# Patient Record
Sex: Female | Born: 1985
Health system: Southern US, Community
[De-identification: ages and names within clinical notes are randomized; demographics above are authoritative.]

## PROBLEM LIST (undated history)

## (undated) DIAGNOSIS — M7072 Other bursitis of hip, left hip: Secondary | ICD-10-CM

## (undated) HISTORY — PX: WISDOM TOOTH EXTRACTION: SHX21

## (undated) HISTORY — PX: EYE SURGERY: SHX253

---

## 2009-07-21 ENCOUNTER — Inpatient Hospital Stay (HOSPITAL_COMMUNITY): Admission: RE | Admit: 2009-07-21 | Discharge: 2009-07-24 | Payer: Self-pay | Admitting: Orthopedic Surgery

## 2010-10-23 LAB — CBC
Hemoglobin: 10.1 g/dL — ABNORMAL LOW (ref 12.0–15.0)
Hemoglobin: 14 g/dL (ref 12.0–15.0)
MCHC: 34.1 g/dL (ref 30.0–36.0)
MCV: 98.3 fL (ref 78.0–100.0)
RBC: 2.96 MIL/uL — ABNORMAL LOW (ref 3.87–5.11)
RBC: 4.18 MIL/uL (ref 3.87–5.11)
RDW: 13.5 % (ref 11.5–15.5)
WBC: 12.8 10*3/uL — ABNORMAL HIGH (ref 4.0–10.5)

## 2010-10-23 LAB — RH IMMUNE GLOB WKUP(>/=20WKS)(NOT WOMEN'S HOSP)

## 2010-10-23 LAB — RPR: RPR Ser Ql: NONREACTIVE

## 2010-11-08 LAB — ABO/RH: RH Type: NEGATIVE

## 2010-11-08 LAB — ANTIBODY SCREEN: Antibody Screen: NEGATIVE

## 2010-11-08 LAB — HIV ANTIBODY (ROUTINE TESTING W REFLEX): HIV: NONREACTIVE

## 2010-11-08 LAB — GC/CHLAMYDIA PROBE AMP, GENITAL: Chlamydia: NEGATIVE

## 2011-03-20 ENCOUNTER — Other Ambulatory Visit: Payer: Self-pay | Admitting: Obstetrics & Gynecology

## 2011-04-25 ENCOUNTER — Other Ambulatory Visit: Payer: Self-pay | Admitting: Obstetrics & Gynecology

## 2011-06-07 ENCOUNTER — Encounter (HOSPITAL_COMMUNITY): Payer: Self-pay

## 2011-06-07 NOTE — Patient Instructions (Addendum)
   Your procedure is scheduled on:  Monday, November 26th  Enter through the Main Entrance of [facility] at:  [employer] up the phone at the desk and dial [phone] and inform us of your arrival.  Please call this number if you have any problems the morning of surgery: [phone]  Remember: Do not eat food after midnight: Sunday Do not drink clear liquids after:midnight  Sunday Take these medicines the morning of surgery with a SIP OF WATER:  none Do not wear jewelry, make-up, or FINGER nail polish Do not wear lotions, powders, or perfumes.  You may not  wear deodorant. Do not shave 48 hours prior to surgery. Do not bring valuables to the hospital.  Leave suitcase in the car. After Surgery it may be brought to your room. For patients being admitted to the hospital, checkout time is 11:00am the day of discharge. Home with husband Barbara Cower cell [phone].  Remember to use your hibiclens as instructed.Please shower with 1/2 bottle the evening before your surgery and the other 1/2 bottle the morning of surgery.

## 2011-06-11 ENCOUNTER — Encounter (HOSPITAL_COMMUNITY): Payer: Self-pay | Admitting: Pharmacy Technician

## 2011-06-12 ENCOUNTER — Encounter (HOSPITAL_COMMUNITY)
Admission: RE | Admit: 2011-06-12 | Discharge: 2011-06-12 | Disposition: A | Discharge status: Home / Self Care | Payer: BC Managed Care – PPO | Source: Ambulatory Visit | Attending: Obstetrics & Gynecology | Admitting: Obstetrics & Gynecology

## 2011-06-12 ENCOUNTER — Encounter (HOSPITAL_COMMUNITY): Payer: Self-pay

## 2011-06-12 HISTORY — DX: Other bursitis of hip, left hip: M70.72

## 2011-06-12 LAB — CBC
HCT: 40.8 % (ref 36.0–46.0)
Hemoglobin: 14 g/dL (ref 12.0–15.0)
MCH: 32.3 pg (ref 26.0–34.0)
MCV: 94.2 fL (ref 78.0–100.0)
Platelets: 200 10*3/uL (ref 150–400)
RBC: 4.33 MIL/uL (ref 3.87–5.11)
WBC: 12.1 10*3/uL — ABNORMAL HIGH (ref 4.0–10.5)

## 2011-06-12 NOTE — Pre-Procedure Instructions (Signed)
OK to see patient on DOS.

## 2011-06-12 NOTE — Pre-Procedure Instructions (Signed)
Called Dr Mechele Collin office, spoke with Kennyth Arnold regarding patient's elevated WBCs 12.1.

## 2011-06-15 NOTE — H&P (Signed)
25 year old G2P1 admitted at [redacted] weeks gestation (EDC 06-24-11) for a repeat C/S after a relatively benign pregnancy.  -GBS.  Blood type O negative with RhoGam given at [redacted] weeks gestation.  Her first C/S was in 12/10 (an elective primary) with delivery of a 7 pound female infant.  PE:  VS stable Chest - Clear CV - RR without murmur Abd:  S+37  FH + CX:  Closed with vtx at -2  IMP:  39 week IUP with a previous C/S who will have repeat C/S Plan:  Repeat C/S at [redacted] weeks gestation

## 2011-06-18 ENCOUNTER — Encounter (HOSPITAL_COMMUNITY): Payer: Self-pay | Admitting: *Deleted

## 2011-06-18 ENCOUNTER — Encounter (HOSPITAL_COMMUNITY): Payer: Self-pay | Admitting: Anesthesiology

## 2011-06-18 ENCOUNTER — Encounter (HOSPITAL_COMMUNITY)
Admission: RE | Disposition: A | Discharge status: Home / Self Care | Payer: Self-pay | Source: Ambulatory Visit | Attending: Obstetrics & Gynecology

## 2011-06-18 ENCOUNTER — Inpatient Hospital Stay (HOSPITAL_COMMUNITY)
Admission: RE | Admit: 2011-06-18 | Discharge: 2011-06-20 | DRG: 371 | Disposition: A | Discharge status: Home / Self Care | Payer: BC Managed Care – PPO | Source: Ambulatory Visit | Attending: Obstetrics & Gynecology | Admitting: Obstetrics & Gynecology

## 2011-06-18 ENCOUNTER — Inpatient Hospital Stay (HOSPITAL_COMMUNITY): Payer: BC Managed Care – PPO | Admitting: Anesthesiology

## 2011-06-18 DIAGNOSIS — O34219 Maternal care for unspecified type scar from previous cesarean delivery: Principal | ICD-10-CM | POA: Diagnosis present

## 2011-06-18 DIAGNOSIS — Z3480 Encounter for supervision of other normal pregnancy, unspecified trimester: Secondary | ICD-10-CM

## 2011-06-18 SURGERY — Surgical Case
Anesthesia: Spinal | Site: Abdomen | Wound class: Clean Contaminated

## 2011-06-18 MED ORDER — CEFAZOLIN SODIUM 1-5 GM-% IV SOLN
INTRAVENOUS | Status: AC
  Administered 2011-06-18: 1 g via INTRAVENOUS
  Filled 2011-06-18: qty 50

## 2011-06-18 MED ORDER — OXYTOCIN 20 UNITS IN LACTATED RINGERS INFUSION - SIMPLE: 125.0000 mL/h | INTRAVENOUS | Status: AC

## 2011-06-18 MED ORDER — PRENATAL PLUS 27-1 MG PO TABS: 1.0000 | ORAL_TABLET | Freq: Every day | ORAL | Status: DC

## 2011-06-18 MED ORDER — ONDANSETRON HCL 4 MG/2ML IJ SOLN
INTRAMUSCULAR | Status: AC
  Filled 2011-06-18: qty 2

## 2011-06-18 MED ORDER — WITCH HAZEL-GLYCERIN EX PADS: 1.0000 "application " | MEDICATED_PAD | CUTANEOUS | Status: DC | PRN

## 2011-06-18 MED ORDER — PHENYLEPHRINE 40 MCG/ML (10ML) SYRINGE FOR IV PUSH (FOR BLOOD PRESSURE SUPPORT)
PREFILLED_SYRINGE | INTRAVENOUS | Status: AC
  Filled 2011-06-18: qty 5

## 2011-06-18 MED ORDER — OXYCODONE-ACETAMINOPHEN 5-325 MG/5ML PO SOLN
5.0000 mL | ORAL | Status: DC | PRN
  Administered 2011-06-18 – 2011-06-19 (×4): 5 mL via ORAL
  Administered 2011-06-20 (×3): 10 mL via ORAL
  Administered 2011-06-20: 5 mL via ORAL
  Filled 2011-06-18: qty 10
  Filled 2011-06-18 (×3): qty 5
  Filled 2011-06-18: qty 10
  Filled 2011-06-18 (×2): qty 5
  Filled 2011-06-18: qty 10

## 2011-06-18 MED ORDER — NALBUPHINE HCL 10 MG/ML IJ SOLN
5.0000 mg | INTRAMUSCULAR | Status: DC | PRN
  Filled 2011-06-18: qty 1

## 2011-06-18 MED ORDER — SCOPOLAMINE 1 MG/3DAYS TD PT72: 1.0000 | MEDICATED_PATCH | Freq: Once | TRANSDERMAL | Status: DC

## 2011-06-18 MED ORDER — DIPHENHYDRAMINE HCL 25 MG PO CAPS: 25.0000 mg | ORAL_CAPSULE | Freq: Four times a day (QID) | ORAL | Status: DC | PRN

## 2011-06-18 MED ORDER — KETOROLAC TROMETHAMINE 30 MG/ML IJ SOLN: 30.0000 mg | Freq: Four times a day (QID) | INTRAMUSCULAR | Status: AC | PRN

## 2011-06-18 MED ORDER — SODIUM CHLORIDE 0.9 % IJ SOLN: 3.0000 mL | INTRAMUSCULAR | Status: DC | PRN

## 2011-06-18 MED ORDER — LANOLIN HYDROUS EX OINT: 1.0000 "application " | TOPICAL_OINTMENT | CUTANEOUS | Status: DC | PRN

## 2011-06-18 MED ORDER — LACTATED RINGERS IV SOLN
INTRAVENOUS | Status: DC
  Administered 2011-06-18: 08:00:00 via INTRAVENOUS
  Administered 2011-06-18: 1000 mL via INTRAVENOUS
  Administered 2011-06-18 (×2): via INTRAVENOUS

## 2011-06-18 MED ORDER — CEFAZOLIN SODIUM 1-5 GM-% IV SOLN: 1.0000 g | INTRAVENOUS | Status: DC

## 2011-06-18 MED ORDER — DIPHENHYDRAMINE HCL 50 MG/ML IJ SOLN: 25.0000 mg | INTRAMUSCULAR | Status: DC | PRN

## 2011-06-18 MED ORDER — NALOXONE HCL 0.4 MG/ML IJ SOLN: 0.4000 mg | INTRAMUSCULAR | Status: DC | PRN

## 2011-06-18 MED ORDER — SENNOSIDES-DOCUSATE SODIUM 8.6-50 MG PO TABS
2.0000 | ORAL_TABLET | Freq: Every day | ORAL | Status: DC
  Administered 2011-06-18 – 2011-06-19 (×2): 2 via ORAL

## 2011-06-18 MED ORDER — COMPLETENATE 29-1 MG PO CHEW
1.0000 | CHEWABLE_TABLET | Freq: Every day | ORAL | Status: DC
  Administered 2011-06-19 – 2011-06-20 (×2): 1 via ORAL
  Filled 2011-06-18 (×4): qty 1

## 2011-06-18 MED ORDER — MENTHOL 3 MG MT LOZG: 1.0000 | LOZENGE | OROMUCOSAL | Status: DC | PRN

## 2011-06-18 MED ORDER — METOCLOPRAMIDE HCL 5 MG/ML IJ SOLN: 10.0000 mg | Freq: Three times a day (TID) | INTRAMUSCULAR | Status: DC | PRN

## 2011-06-18 MED ORDER — ONDANSETRON HCL 4 MG/2ML IJ SOLN: 4.0000 mg | INTRAMUSCULAR | Status: DC | PRN

## 2011-06-18 MED ORDER — SIMETHICONE 80 MG PO CHEW: 80.0000 mg | CHEWABLE_TABLET | ORAL | Status: DC | PRN

## 2011-06-18 MED ORDER — KETOROLAC TROMETHAMINE 30 MG/ML IJ SOLN
30.0000 mg | Freq: Four times a day (QID) | INTRAMUSCULAR | Status: AC | PRN
  Administered 2011-06-18: 30 mg via INTRAVENOUS

## 2011-06-18 MED ORDER — ONDANSETRON HCL 4 MG/2ML IJ SOLN: 4.0000 mg | Freq: Three times a day (TID) | INTRAMUSCULAR | Status: DC | PRN

## 2011-06-18 MED ORDER — IBUPROFEN 600 MG PO TABS: 600.0000 mg | ORAL_TABLET | Freq: Four times a day (QID) | ORAL | Status: DC | PRN

## 2011-06-18 MED ORDER — SODIUM CHLORIDE 0.9 % IV SOLN
1.0000 ug/kg/h | INTRAVENOUS | Status: DC | PRN
  Filled 2011-06-18: qty 2.5

## 2011-06-18 MED ORDER — OXYTOCIN 10 UNIT/ML IJ SOLN
INTRAMUSCULAR | Status: AC
  Filled 2011-06-18: qty 4

## 2011-06-18 MED ORDER — ONDANSETRON HCL 4 MG PO TABS
4.0000 mg | ORAL_TABLET | ORAL | Status: DC | PRN
  Administered 2011-06-20: 4 mg via ORAL
  Filled 2011-06-18: qty 1

## 2011-06-18 MED ORDER — TETANUS-DIPHTH-ACELL PERTUSSIS 5-2.5-18.5 LF-MCG/0.5 IM SUSP
0.5000 mL | Freq: Once | INTRAMUSCULAR | Status: AC
  Administered 2011-06-20: 0.5 mL via INTRAMUSCULAR
  Filled 2011-06-18: qty 0.5

## 2011-06-18 MED ORDER — FENTANYL CITRATE 0.05 MG/ML IJ SOLN
INTRAMUSCULAR | Status: DC | PRN
  Administered 2011-06-18: 25 ug via INTRATHECAL

## 2011-06-18 MED ORDER — SODIUM CHLORIDE 0.9 % IR SOLN
Status: DC | PRN
  Administered 2011-06-18: 1000 mL

## 2011-06-18 MED ORDER — BUPIVACAINE IN DEXTROSE 0.75-8.25 % IT SOLN
INTRATHECAL | Status: DC | PRN
  Administered 2011-06-18: 11 mg via INTRATHECAL

## 2011-06-18 MED ORDER — DIBUCAINE 1 % RE OINT: 1.0000 "application " | TOPICAL_OINTMENT | RECTAL | Status: DC | PRN

## 2011-06-18 MED ORDER — ONDANSETRON HCL 4 MG/2ML IJ SOLN
INTRAMUSCULAR | Status: DC | PRN
  Administered 2011-06-18: 4 mg via INTRAVENOUS

## 2011-06-18 MED ORDER — IBUPROFEN 600 MG PO TABS: 600.0000 mg | ORAL_TABLET | Freq: Four times a day (QID) | ORAL | Status: DC

## 2011-06-18 MED ORDER — HYDROMORPHONE HCL PF 1 MG/ML IJ SOLN: 0.2500 mg | INTRAMUSCULAR | Status: DC | PRN

## 2011-06-18 MED ORDER — OXYCODONE-ACETAMINOPHEN 5-325 MG PO TABS: 1.0000 | ORAL_TABLET | ORAL | Status: DC | PRN

## 2011-06-18 MED ORDER — DIPHENHYDRAMINE HCL 50 MG/ML IJ SOLN: 12.5000 mg | INTRAMUSCULAR | Status: DC | PRN

## 2011-06-18 MED ORDER — LACTATED RINGERS IV SOLN
INTRAVENOUS | Status: DC
  Administered 2011-06-20: 10:00:00 via INTRAVENOUS

## 2011-06-18 MED ORDER — SCOPOLAMINE 1 MG/3DAYS TD PT72
1.0000 | MEDICATED_PATCH | Freq: Once | TRANSDERMAL | Status: DC
  Filled 2011-06-18: qty 1

## 2011-06-18 MED ORDER — SIMETHICONE 80 MG PO CHEW
80.0000 mg | CHEWABLE_TABLET | Freq: Three times a day (TID) | ORAL | Status: DC
  Administered 2011-06-18 – 2011-06-20 (×5): 80 mg via ORAL

## 2011-06-18 MED ORDER — OXYTOCIN 20 UNITS IN LACTATED RINGERS INFUSION - SIMPLE
INTRAVENOUS | Status: DC | PRN
  Administered 2011-06-18 (×2): 20 [IU] via INTRAVENOUS

## 2011-06-18 MED ORDER — ZOLPIDEM TARTRATE 5 MG PO TABS: 5.0000 mg | ORAL_TABLET | Freq: Every evening | ORAL | Status: DC | PRN

## 2011-06-18 MED ORDER — DIPHENHYDRAMINE HCL 25 MG PO CAPS: 25.0000 mg | ORAL_CAPSULE | ORAL | Status: DC | PRN

## 2011-06-18 MED ORDER — MORPHINE SULFATE 0.5 MG/ML IJ SOLN
INTRAMUSCULAR | Status: AC
  Filled 2011-06-18: qty 10

## 2011-06-18 MED ORDER — KETOROLAC TROMETHAMINE 60 MG/2ML IM SOLN
60.0000 mg | Freq: Once | INTRAMUSCULAR | Status: AC | PRN
  Filled 2011-06-18: qty 2

## 2011-06-18 MED ORDER — PHENYLEPHRINE HCL 10 MG/ML IJ SOLN
INTRAMUSCULAR | Status: DC | PRN
  Administered 2011-06-18 (×2): 40 ug via INTRAVENOUS

## 2011-06-18 MED ORDER — MEPERIDINE HCL 25 MG/ML IJ SOLN: 6.2500 mg | INTRAMUSCULAR | Status: DC | PRN

## 2011-06-18 MED ORDER — KETOROLAC TROMETHAMINE 30 MG/ML IJ SOLN
INTRAMUSCULAR | Status: AC
  Administered 2011-06-18: 30 mg via INTRAVENOUS
  Filled 2011-06-18: qty 1

## 2011-06-18 MED ORDER — IBUPROFEN 100 MG/5ML PO SUSP
600.0000 mg | Freq: Four times a day (QID) | ORAL | Status: DC
  Administered 2011-06-18 – 2011-06-20 (×8): 600 mg via ORAL
  Filled 2011-06-18 (×13): qty 30

## 2011-06-18 MED ORDER — MORPHINE SULFATE (PF) 0.5 MG/ML IJ SOLN
INTRAMUSCULAR | Status: DC | PRN
  Administered 2011-06-18: .1 mg via INTRATHECAL

## 2011-06-18 MED ORDER — FENTANYL CITRATE 0.05 MG/ML IJ SOLN
INTRAMUSCULAR | Status: AC
  Filled 2011-06-18: qty 2

## 2011-06-18 MED ORDER — SCOPOLAMINE 1 MG/3DAYS TD PT72
MEDICATED_PATCH | TRANSDERMAL | Status: AC
  Filled 2011-06-18: qty 1

## 2011-06-18 SURGICAL SUPPLY — 28 items
CLOTH BEACON ORANGE TIMEOUT ST (SAFETY) ×2 IMPLANT
DRESSING TELFA 8X3 (GAUZE/BANDAGES/DRESSINGS) ×2 IMPLANT
ELECT REM PT RETURN 9FT ADLT (ELECTROSURGICAL) ×2
ELECTRODE REM PT RTRN 9FT ADLT (ELECTROSURGICAL) ×1 IMPLANT
EXTRACTOR VACUUM M CUP 4 TUBE (SUCTIONS) IMPLANT
GAUZE SPONGE 4X4 12PLY STRL LF (GAUZE/BANDAGES/DRESSINGS) ×2 IMPLANT
GLOVE BIO SURGEON STRL SZ7.5 (GLOVE) ×4 IMPLANT
GOWN PREVENTION PLUS LG XLONG (DISPOSABLE) ×4 IMPLANT
GOWN PREVENTION PLUS XLARGE (GOWN DISPOSABLE) ×2 IMPLANT
KIT ABG SYR 3ML LUER SLIP (SYRINGE) IMPLANT
NEEDLE HYPO 25X5/8 SAFETYGLIDE (NEEDLE) IMPLANT
NS IRRIG 1000ML POUR BTL (IV SOLUTION) ×2 IMPLANT
PACK C SECTION WH (CUSTOM PROCEDURE TRAY) ×2 IMPLANT
PAD ABD 7.5X8 STRL (GAUZE/BANDAGES/DRESSINGS) ×2 IMPLANT
SLEEVE SCD COMPRESS KNEE MED (MISCELLANEOUS) ×2 IMPLANT
STAPLER VISISTAT 35W (STAPLE) ×2 IMPLANT
SUT PLAIN 1 NONE 54 (SUTURE) ×2 IMPLANT
SUT PROLENE 0 TP 1 60 (SUTURE) IMPLANT
SUT VIC AB 0 CT1 27 (SUTURE) ×3
SUT VIC AB 0 CT1 27XBRD ANBCTR (SUTURE) ×3 IMPLANT
SUT VIC AB 1 CTX 36 (SUTURE) ×2
SUT VIC AB 1 CTX36XBRD ANBCTRL (SUTURE) ×2 IMPLANT
SUT VIC AB 3-0 SH 27 (SUTURE)
SUT VIC AB 3-0 SH 27X BRD (SUTURE) IMPLANT
SUT VICRYL 4-0 PS2 18IN ABS (SUTURE) IMPLANT
TOWEL OR 17X24 6PK STRL BLUE (TOWEL DISPOSABLE) ×4 IMPLANT
TRAY FOLEY CATH 14FR (SET/KITS/TRAYS/PACK) ×2 IMPLANT
WATER STERILE IRR 1000ML POUR (IV SOLUTION) IMPLANT

## 2011-06-18 NOTE — Op Note (Signed)
Preoperative diagnoses  #1-39 week intrauterine pregnancy, previous cesarean section, desire for repeat.  Postop diagnoses  #1-same plus delivery of 8 lbs. 1 oz. Female infant Apgars 9 and 9  Procedure  Repeat low transverse cesarean section  Surgeon-Dr. Aldona Bar Asst.-Dr. Greta Doom Anesthesia-subarachnoid block  Summary-this patient, a gravida 2 para 1 at [redacted] weeks gestation presents for a repeat cesarean section at term. Her pregnancy has been fairly uncomplicated.  Patient was taken to the operating room for after the satisfactory induction of a subarachnoid block she was prepped and draped in the usual fashion with a Foley catheter placed before the prep. She did receive 1 g of Ancef IV preoperatively and an appropriate timeout was carried out as well.  After documentation of good anesthetic levels procedure was begun.  A Pfannenstiel incision was made with minimal difficulty dissected down to the fascia which was incised as well and a low transverse fashion. There was minimal subfascial space and the peritoneum was entered easily with care taken to avoid the bladder inferiorly and the towels superiorly. At this time the bladder blade was placed and the vesicouterine peritoneum was incised in low transverse fashion and pushed off the lower uterine segment with ease. Sharp incision into the lower segment was then carried out in low transverse fashion with Metzenbaum scissors. Amniotomy was produced with production of clear fluid and the incision was extended laterally with fingers.  At this time from a vertex position a viable female infant was delivered without difficulty. The infant cried spontaneously at once and after the cord was clamped and cut the infant was passed off the waiting team headed  by Dr. Alison Murray. Weight was found to be 8 lbs. 1 oz. And Apgars were assigned at 9 and 9 and the infant was taken to the nursery in good condition. The placenta was delivered intact. The uterus was  exteriorized and rendered free of any remaining products of conception. Good uterine contractile it was before slowly given intravenous Pitocin and manual stimulation.  At this time closure of the uterine incision was carried out using single layer of #1 Vicryl in a running locking fashion and this was oversewn with several figure-of-eight #1 Vicryls with very adequate hemostasis produced. Tubes and ovaries were inspected and noted to be normal at this time the abdomen was lavaged all free blood and clot, uterine incision was noted to be dry, and the uterus was replaced in the abdominal cavity and after all counts were noted to be corrected no foreign bodies were noted to be remaining in the abdominal cavity closure of the abdominal cavity was carried out.  The abdominal peritoneum was closed with 0 Vicryl in a running fashion and muscles-although somewhat attenuated-were closed the same. Assured of good subfascial hemostasis the fascia was then reapproximated using 0 Vicryl from angle to midline bilaterally. Subcutaneous tissue was then rendered hemostatic. The inferior margin of the incision was then undermined to allow for good skin closure. Skin was then closed with staples and a sterile pressure dressing was applied.  At this time the patient was taken to the recovery room in good condition having tolerated the procedure well. Estimated blood loss approximately 500 cc. All counts correct x2. At the conclusion of the procedure both mother and baby were doing well in their respective recovery areas.

## 2011-06-18 NOTE — Anesthesia Procedure Notes (Signed)
Spinal  Patient location during procedure: OR Start time: 06/18/2011 7:43 AM Staffing Performed by: anesthesiologist  Preanesthetic Checklist Completed: patient identified, site marked, surgical consent, pre-op evaluation, timeout performed, IV checked, risks and benefits discussed and monitors and equipment checked Spinal Block Patient position: sitting Prep: DuraPrep Patient monitoring: heart rate, cardiac monitor, continuous pulse ox and blood pressure Approach: midline Location: L3-4 Injection technique: single-shot Needle Needle type: Sprotte  Needle gauge: 24 G Needle length: 9 cm Assessment Sensory level: T4 Additional Notes Patient identified.  Risk benefits discussed including failed block, incomplete pain control, headache, nerve damage, paralysis, blood pressure changes, nausea, vomiting, reactions to medication both toxic or allergic, and postpartum back pain.  Patient expressed understanding and wished to proceed.  All questions were answered.  Sterile technique used throughout procedure.  CSF was clear.  No parasthesia or other complications.  Please see nursing notes for vital signs.

## 2011-06-18 NOTE — Transfer of Care (Signed)
Immediate Anesthesia Transfer of Care Note  Patient: Shelby Hahn  Procedure(s) Performed:  CESAREAN SECTION  Patient Location: PACU  Anesthesia Type: Spinal  Level of Consciousness: awake, alert  and oriented  Airway & Oxygen Therapy: Patient Spontanous Breathing  Post-op Assessment: Report given to PACU RN and Post -op Vital signs reviewed and stable  Post vital signs: Reviewed and stable  Complications: No apparent anesthesia complications

## 2011-06-18 NOTE — Consult Note (Signed)
Requested to attend scheduled C/S at term gestation with no risk factors reported. At birth infant in vertex and was manually extracted with spontaneous cries and active MAE. No dysmorphic features. Given tactile stim with drying and bulb suction of nose and mouth. Normal female perineum with deep presacral groove but without sinus tract.    Shown to parents and then carried to Transitional Nursery in warm blanket by father. Care to assigned pediatrician.   Dagoberto Ligas MD [facility]

## 2011-06-18 NOTE — Anesthesia Postprocedure Evaluation (Signed)
Anesthesia Post Note  Patient: Shelby Hahn  Procedure(s) Performed:  CESAREAN SECTION  Anesthesia type: Spinal  Patient location: PACU  Post pain: Pain level controlled  Post assessment: Post-op Vital signs reviewed  Last Vitals:  Filed Vitals:   06/18/11 0845  BP:   Pulse: 78  Temp:   Resp: 12    Post vital signs: Reviewed  Level of consciousness: awake  Complications: No apparent anesthesia complications

## 2011-06-18 NOTE — Progress Notes (Signed)
Informed by nurse that I had to examine patient before we can proceed to OR.   PE:  VS stable Chest - clear CV_- RR without murmur Abd-  FH+  S=D  Vtx Cx not checked Ext-  Trace edema  IMP;  Rpt C/S at Term Plan:  Will proceed to OR.

## 2011-06-18 NOTE — Anesthesia Preprocedure Evaluation (Addendum)
Anesthesia Evaluation  Patient identified by MRN, date of birth, ID band Patient awake    Reviewed: Allergy & Precautions, H&P , Patient's Chart, lab work & pertinent test results  Airway Mallampati: II TM Distance: <3 FB Neck ROM: full  Mouth opening: Limited Mouth Opening  Dental No notable dental hx.    Pulmonary  clear to auscultation  Pulmonary exam normal       Cardiovascular Exercise Tolerance: Good regular Normal    Neuro/Psych    GI/Hepatic   Endo/Other    Renal/GU      Musculoskeletal   Abdominal   Peds  Hematology   Anesthesia Other Findings   Reproductive/Obstetrics                          Anesthesia Physical Anesthesia Plan  ASA: II  Anesthesia Plan: Spinal and Epidural   Post-op Pain Management:    Induction:   Airway Management Planned:   Additional Equipment:   Intra-op Plan:   Post-operative Plan:   Informed Consent: I have reviewed the patients History and Physical, chart, labs and discussed the procedure including the risks, benefits and alternatives for the proposed anesthesia with the patient or authorized representative who has indicated his/her understanding and acceptance.   Dental Advisory Given  Plan Discussed with: CRNA  Anesthesia Plan Comments: (Lab work confirmed ......... Platelets okay.  Patient had PDPHA with prior SAB for CS.   Discussed either spinal or epidural anesthetic, and patient consents to the procedure:  included risk of possible headache,backache, failed block, allergic reaction, and nerve injury. This patient was asked if she had any questions or concerns before the procedure started. )        Anesthesia Quick Evaluation

## 2011-06-18 NOTE — Anesthesia Postprocedure Evaluation (Signed)
  Anesthesia Post-op Note  Patient: Shelby Hahn  Procedure(s) Performed:  CESAREAN SECTION  Patient Location: Mother/Baby  Anesthesia Type: Spinal  Level of Consciousness: awake, alert , oriented and patient cooperative  Airway and Oxygen Therapy: Patient Spontanous Breathing  Post-op Pain: mild  Post-op Assessment: Post-op Vital signs reviewed and Patient's Cardiovascular Status Stable  Post-op Vital Signs: Reviewed and stable  Complications: No apparent anesthesia complications

## 2011-06-18 NOTE — Addendum Note (Signed)
Addendum  created 06/18/11 1758 by Erskine Speed, CRNA   Modules edited:Notes Section

## 2011-06-18 NOTE — Progress Notes (Signed)
Status totally unchanged.  Read for Repeat C/S shortly.

## 2011-06-19 ENCOUNTER — Encounter (HOSPITAL_COMMUNITY): Payer: Self-pay | Admitting: Obstetrics & Gynecology

## 2011-06-19 LAB — CBC
HCT: 35.5 % — ABNORMAL LOW (ref 36.0–46.0)
MCV: 95.7 fL (ref 78.0–100.0)
RBC: 3.71 MIL/uL — ABNORMAL LOW (ref 3.87–5.11)
WBC: 12 10*3/uL — ABNORMAL HIGH (ref 4.0–10.5)

## 2011-06-19 MED ORDER — RHO D IMMUNE GLOBULIN 1500 UNIT/2ML IJ SOLN
300.0000 ug | Freq: Once | INTRAMUSCULAR | Status: AC
  Administered 2011-06-19: 300 ug via INTRAMUSCULAR
  Filled 2011-06-19: qty 2

## 2011-06-20 LAB — RH IG WORKUP (INCLUDES ABO/RH)
Antibody Screen: POSITIVE
DAT, IgG: NEGATIVE
Fetal Screen: NEGATIVE

## 2011-06-20 MED ORDER — DEXTROSE IN LACTATED RINGERS 5 % IV SOLN: INTRAVENOUS | Status: DC

## 2011-06-20 MED ORDER — LACTATED RINGERS IV SOLN: INTRAVENOUS | Status: DC

## 2011-06-20 NOTE — Progress Notes (Signed)
Post Op Day 1 Subjective: no complaints  Objective: Blood pressure 106/70, pulse 83, temperature 97.9 F (36.6 C), temperature source Oral, resp. rate 19, weight 72.576 kg (160 lb), last menstrual period 09/17/2010, SpO2 100.00%, unknown if currently breastfeeding.  Physical Exam:  General: alert Lochia: appropriate Uterine Fundus: firm Incision: no significant drainage   Basename 06/19/11 0527  HGB 12.0  HCT 35.5*    Assessment/Plan: I saw Ms. Joyce yesterday 11/27 but was called away before I recorded the encounter.  She was doing well and requested early discharge (today 11/28) if she was doing well.  I circumcised her baby on 11/27.   LOS: 2 days   Mickel Baas 06/20/2011, 6:55 AM

## 2011-06-20 NOTE — Progress Notes (Signed)
Subjective: Postpartum Day 2: Cesarean Delivery Patient reports nausea and vomiting.  C/O headache that she & husband believe is spinal headache. Had similar problem after last delivery.  Symptoms improved with IVF and supplemental oxygen last time.  Pt wishes to proceed with these interventions first.  Desires d/c home today.  Objective: Vital signs in last 24 hours: Temp:  [97.8 F (36.6 C)-98.2 F (36.8 C)] 97.9 F (36.6 C) (11/28 0527) Pulse Rate:  [80-93] 83  (11/28 0527) Resp:  [19-20] 19  (11/28 0527) BP: (101-163)/(59-74) 106/70 mmHg (11/28 0527) SpO2:  [100 %] 100 % (11/28 0527)  Physical Exam:  General: alert, cooperative, appears stated age, mild distress and pt laying in bed with supplemental oxygen in place Lochia: appropriate Uterine Fundus: firm Incision: healing well DVT Evaluation: No evidence of DVT seen on physical exam.   Basename 06/19/11 0527  HGB 12.0  HCT 35.5*    Assessment/Plan: Status post Cesarean section. Doing well postoperatively. Spinal headach.  Proceed with IVF hydration and supplementatl oxygen.  If symptoms don't improve advised to try fiorocet and some soda  Continue current care. If symptoms improve will see if patient able to d/c home per her request.  Nollie Shiflett H. 06/20/2011, 10:17 AM

## 2011-06-20 NOTE — Discharge Summary (Signed)
Discharge diagnoses  #1-39 week intrauterine pregnancy delivered 8 lbs. 1 oz. Female infant Apgars 9 and 9  #2-blood type O negative-RhoGAM given  #3-repeat cesarean section  Procedures  Repeat cesarean section  Summary-this patient underwent a repeat cesarean section at [redacted] weeks gestation after a relatively normal gestation with delivery of an 8 lbs. 1 oz. Female infant with good Apgars. Her postoperative course was totally benign. Her discharge hemoglobin was 12.0. On the morning of 11/28 she was ready for discharge but was having a headache that sounded like a spinal headache. Anesthesia was consulted and recommended fluid bolus.  On the morning of 11/28, otherwise she was doing well. She was having normal bowel and bladder function her wound is clean and dry and her vital signs were stable.  She was given a discharge rupture at the time of discharge and instructions well. Discharge medications will include vitamins-1 and as long as breast-feeding and she was given prescriptions for Roxicet oral suspension to use 1-2 teaspoons every 4-6 hours as needed for severe pain, Fioricet 1-2 every 4-6 hours as needed for headache, and ibuprofen suspension 100 mg/5 cc to use for teaspoons every 4-6 hours as he for cramping or mild pain.  She will return to the office for followup on Friday, 2 days hence, to have her staples removed and her wound Steri-Stripped with benzoin. She will return the office for followup for her postpartum check in approximately 4 weeks' time.  Condition on discharge improved

## 2011-06-20 NOTE — Progress Notes (Signed)
Nursing Note: Pt c/o headache with N/V this morning upon shift assessment. Anesthesia MD was notified; orders received. Interventions were discussed with patient and spouse. At patient's request, interventions were completed. Pt states S/S were well relieved. Pt expressed satisfaction with care.   Forrest Moron, RN

## 2014-05-24 ENCOUNTER — Encounter (HOSPITAL_COMMUNITY): Payer: Self-pay | Admitting: Obstetrics & Gynecology

## 2016-08-28 LAB — OB RESULTS CONSOLE ANTIBODY SCREEN: Antibody Screen: NEGATIVE

## 2016-08-28 LAB — OB RESULTS CONSOLE HIV ANTIBODY (ROUTINE TESTING): HIV: NONREACTIVE

## 2016-08-28 LAB — OB RESULTS CONSOLE ABO/RH: RH Type: NEGATIVE

## 2016-08-28 LAB — OB RESULTS CONSOLE GC/CHLAMYDIA
CHLAMYDIA, DNA PROBE: NEGATIVE
GC PROBE AMP, GENITAL: NEGATIVE

## 2016-08-28 LAB — OB RESULTS CONSOLE RUBELLA ANTIBODY, IGM: RUBELLA: UNDETERMINED

## 2016-08-28 LAB — OB RESULTS CONSOLE HEPATITIS B SURFACE ANTIGEN: Hepatitis B Surface Ag: NEGATIVE

## 2016-08-28 LAB — OB RESULTS CONSOLE RPR: RPR: NONREACTIVE

## 2017-02-08 ENCOUNTER — Other Ambulatory Visit: Payer: Self-pay | Admitting: Obstetrics & Gynecology

## 2017-02-15 LAB — OB RESULTS CONSOLE GBS: GBS: POSITIVE

## 2017-02-21 ENCOUNTER — Encounter (HOSPITAL_COMMUNITY): Payer: Self-pay | Admitting: *Deleted

## 2017-03-06 NOTE — Patient Instructions (Signed)
20 Shelby Hahn  03/06/2017   Your procedure is scheduled on:  03/08/2017  Enter through the [facility] of [facility] at 1000 AM.  Pick up the phone at the desk and dial [phone].   Call this number if you have problems the morning of surgery: [phone]   Remember:   Do not eat food:After Midnight.  Do not drink clear liquids: After Midnight.  Take these medicines the morning of surgery with A SIP OF WATER: none   Do not wear jewelry, make-up or nail polish.  Do not wear lotions, powders, or perfumes. Do not wear deodorant.  Do not shave 48 hours prior to surgery.  Do not bring valuables to the hospital.  [facility] not   responsible for any belongings or valuables brought to the hospital.  Contacts, dentures or bridgework may not be worn into surgery.  Leave suitcase in the car. After surgery it may be brought to your room.  For patients admitted to the hospital, checkout time is 11:00 AM the day of              discharge.   Patients discharged the day of surgery will not be allowed to drive             home.  Name and phone number of your driver: na  Special Instructions:   N/A   Please read over the following fact sheets that you were given:   Surgical Site Infection Prevention

## 2017-03-07 ENCOUNTER — Encounter (HOSPITAL_COMMUNITY)
Admission: RE | Admit: 2017-03-07 | Discharge: 2017-03-07 | Disposition: A | Discharge status: Home / Self Care | Payer: BLUE CROSS/BLUE SHIELD | Source: Ambulatory Visit | Attending: Obstetrics & Gynecology | Admitting: Obstetrics & Gynecology

## 2017-03-07 LAB — CBC
HCT: 35.5 % — ABNORMAL LOW (ref 36.0–46.0)
Hemoglobin: 11.9 g/dL — ABNORMAL LOW (ref 12.0–15.0)
MCH: 31.2 pg (ref 26.0–34.0)
MCHC: 33.5 g/dL (ref 30.0–36.0)
MCV: 92.9 fL (ref 78.0–100.0)
PLATELETS: 186 10*3/uL (ref 150–400)
RBC: 3.82 MIL/uL — ABNORMAL LOW (ref 3.87–5.11)
RDW: 14.9 % (ref 11.5–15.5)
WBC: 7.8 10*3/uL (ref 4.0–10.5)

## 2017-03-08 ENCOUNTER — Encounter (HOSPITAL_COMMUNITY)
Admission: AD | Disposition: A | Discharge status: Home / Self Care | Payer: Self-pay | Source: Ambulatory Visit | Attending: Obstetrics & Gynecology

## 2017-03-08 ENCOUNTER — Inpatient Hospital Stay (HOSPITAL_COMMUNITY): Payer: BLUE CROSS/BLUE SHIELD | Admitting: Anesthesiology

## 2017-03-08 ENCOUNTER — Encounter (HOSPITAL_COMMUNITY): Payer: Self-pay | Admitting: *Deleted

## 2017-03-08 ENCOUNTER — Inpatient Hospital Stay (HOSPITAL_COMMUNITY)
Admission: AD | Admit: 2017-03-08 | Discharge: 2017-03-10 | DRG: 766 | Disposition: A | Discharge status: Home / Self Care | Payer: BLUE CROSS/BLUE SHIELD | Source: Ambulatory Visit | Attending: Obstetrics & Gynecology | Admitting: Obstetrics & Gynecology

## 2017-03-08 DIAGNOSIS — O34211 Maternal care for low transverse scar from previous cesarean delivery: Principal | ICD-10-CM | POA: Diagnosis present

## 2017-03-08 DIAGNOSIS — O99824 Streptococcus B carrier state complicating childbirth: Secondary | ICD-10-CM | POA: Diagnosis present

## 2017-03-08 DIAGNOSIS — Z3A39 39 weeks gestation of pregnancy: Secondary | ICD-10-CM | POA: Diagnosis not present

## 2017-03-08 DIAGNOSIS — Z98891 History of uterine scar from previous surgery: Secondary | ICD-10-CM

## 2017-03-08 LAB — RPR: RPR Ser Ql: NONREACTIVE

## 2017-03-08 SURGERY — Surgical Case
Anesthesia: Spinal

## 2017-03-08 MED ORDER — DIPHENHYDRAMINE HCL 25 MG PO CAPS: 25.0000 mg | ORAL_CAPSULE | Freq: Four times a day (QID) | ORAL | Status: DC | PRN

## 2017-03-08 MED ORDER — BUPIVACAINE HCL 0.25 % IJ SOLN
INTRAMUSCULAR | Status: DC | PRN
  Administered 2017-03-08: 30 mL

## 2017-03-08 MED ORDER — PRENATAL MULTIVITAMIN CH
1.0000 | ORAL_TABLET | Freq: Every day | ORAL | Status: DC
  Administered 2017-03-09 – 2017-03-10 (×2): 1 via ORAL
  Filled 2017-03-08 (×2): qty 1

## 2017-03-08 MED ORDER — SIMETHICONE 80 MG PO CHEW: 80.0000 mg | CHEWABLE_TABLET | ORAL | Status: DC | PRN

## 2017-03-08 MED ORDER — OXYTOCIN 10 UNIT/ML IJ SOLN
INTRAVENOUS | Status: DC | PRN
  Administered 2017-03-08: 40 [IU] via INTRAVENOUS

## 2017-03-08 MED ORDER — OXYTOCIN 10 UNIT/ML IJ SOLN
INTRAMUSCULAR | Status: AC
  Filled 2017-03-08: qty 4

## 2017-03-08 MED ORDER — SODIUM CHLORIDE 0.9 % IR SOLN
Status: DC | PRN
  Administered 2017-03-08: 1

## 2017-03-08 MED ORDER — MORPHINE SULFATE (PF) 0.5 MG/ML IJ SOLN
INTRAMUSCULAR | Status: AC
  Filled 2017-03-08: qty 10

## 2017-03-08 MED ORDER — LACTATED RINGERS IV SOLN
INTRAVENOUS | Status: DC | PRN
  Administered 2017-03-08: 13:00:00 via INTRAVENOUS

## 2017-03-08 MED ORDER — FENTANYL CITRATE (PF) 100 MCG/2ML IJ SOLN
INTRAMUSCULAR | Status: AC
  Filled 2017-03-08: qty 2

## 2017-03-08 MED ORDER — SOD CITRATE-CITRIC ACID 500-334 MG/5ML PO SOLN
30.0000 mL | Freq: Once | ORAL | Status: AC
  Administered 2017-03-08: 30 mL via ORAL

## 2017-03-08 MED ORDER — MENTHOL 3 MG MT LOZG: 1.0000 | LOZENGE | OROMUCOSAL | Status: DC | PRN

## 2017-03-08 MED ORDER — PHENYLEPHRINE 8 MG IN D5W 100 ML (0.08MG/ML) PREMIX OPTIME
INJECTION | INTRAVENOUS | Status: AC
  Filled 2017-03-08: qty 100

## 2017-03-08 MED ORDER — LACTATED RINGERS IV SOLN
INTRAVENOUS | Status: DC
  Administered 2017-03-08: 21:00:00 via INTRAVENOUS

## 2017-03-08 MED ORDER — ACETAMINOPHEN 325 MG PO TABS
650.0000 mg | ORAL_TABLET | ORAL | Status: DC | PRN
  Administered 2017-03-09 – 2017-03-10 (×3): 650 mg via ORAL
  Filled 2017-03-08 (×3): qty 2

## 2017-03-08 MED ORDER — PROMETHAZINE HCL 25 MG/ML IJ SOLN: 6.2500 mg | INTRAMUSCULAR | Status: DC | PRN

## 2017-03-08 MED ORDER — MEPERIDINE HCL 25 MG/ML IJ SOLN
INTRAMUSCULAR | Status: AC
  Filled 2017-03-08: qty 1

## 2017-03-08 MED ORDER — SCOPOLAMINE 1 MG/3DAYS TD PT72
MEDICATED_PATCH | TRANSDERMAL | Status: AC
  Filled 2017-03-08: qty 1

## 2017-03-08 MED ORDER — CEFAZOLIN SODIUM-DEXTROSE 2-4 GM/100ML-% IV SOLN
2.0000 g | INTRAVENOUS | Status: AC
  Administered 2017-03-08: 2 g via INTRAVENOUS
  Filled 2017-03-08: qty 100

## 2017-03-08 MED ORDER — MORPHINE SULFATE (PF) 4 MG/ML IV SOLN: 1.0000 mg | INTRAVENOUS | Status: DC | PRN

## 2017-03-08 MED ORDER — WITCH HAZEL-GLYCERIN EX PADS: 1.0000 "application " | MEDICATED_PAD | CUTANEOUS | Status: DC | PRN

## 2017-03-08 MED ORDER — SCOPOLAMINE 1 MG/3DAYS TD PT72
1.0000 | MEDICATED_PATCH | Freq: Once | TRANSDERMAL | Status: DC
  Administered 2017-03-08: 1.5 mg via TRANSDERMAL

## 2017-03-08 MED ORDER — ZOLPIDEM TARTRATE 5 MG PO TABS: 5.0000 mg | ORAL_TABLET | Freq: Every evening | ORAL | Status: DC | PRN

## 2017-03-08 MED ORDER — PHENYLEPHRINE 8 MG IN D5W 100 ML (0.08MG/ML) PREMIX OPTIME
INJECTION | INTRAVENOUS | Status: DC | PRN
  Administered 2017-03-08: 60 ug/min via INTRAVENOUS

## 2017-03-08 MED ORDER — MEPERIDINE HCL 25 MG/ML IJ SOLN
INTRAMUSCULAR | Status: DC | PRN
  Administered 2017-03-08: 25 mg via INTRAVENOUS

## 2017-03-08 MED ORDER — MIDAZOLAM HCL 2 MG/2ML IJ SOLN: 0.5000 mg | Freq: Once | INTRAMUSCULAR | Status: DC | PRN

## 2017-03-08 MED ORDER — ONDANSETRON HCL 4 MG/2ML IJ SOLN
INTRAMUSCULAR | Status: AC
  Filled 2017-03-08: qty 2

## 2017-03-08 MED ORDER — BUPIVACAINE IN DEXTROSE 0.75-8.25 % IT SOLN
INTRATHECAL | Status: AC
  Filled 2017-03-08: qty 2

## 2017-03-08 MED ORDER — DEXAMETHASONE SODIUM PHOSPHATE 10 MG/ML IJ SOLN
INTRAMUSCULAR | Status: AC
  Filled 2017-03-08: qty 1

## 2017-03-08 MED ORDER — SIMETHICONE 80 MG PO CHEW
80.0000 mg | CHEWABLE_TABLET | ORAL | Status: DC
  Administered 2017-03-08 – 2017-03-09 (×2): 80 mg via ORAL
  Filled 2017-03-08 (×2): qty 1

## 2017-03-08 MED ORDER — FENTANYL CITRATE (PF) 100 MCG/2ML IJ SOLN
INTRAMUSCULAR | Status: DC | PRN
  Administered 2017-03-08: 10 ug via INTRATHECAL

## 2017-03-08 MED ORDER — KETOROLAC TROMETHAMINE 30 MG/ML IJ SOLN: 30.0000 mg | Freq: Once | INTRAMUSCULAR | Status: DC

## 2017-03-08 MED ORDER — COCONUT OIL OIL: 1.0000 "application " | TOPICAL_OIL | Status: DC | PRN

## 2017-03-08 MED ORDER — OXYTOCIN 40 UNITS IN LACTATED RINGERS INFUSION - SIMPLE MED: 2.5000 [IU]/h | INTRAVENOUS | Status: AC

## 2017-03-08 MED ORDER — SIMETHICONE 80 MG PO CHEW
80.0000 mg | CHEWABLE_TABLET | Freq: Three times a day (TID) | ORAL | Status: DC
  Administered 2017-03-09 – 2017-03-10 (×4): 80 mg via ORAL
  Filled 2017-03-08 (×6): qty 1

## 2017-03-08 MED ORDER — OXYCODONE-ACETAMINOPHEN 5-325 MG PO TABS
1.0000 | ORAL_TABLET | ORAL | Status: DC | PRN
Start: 2017-03-08 — End: 2017-03-10

## 2017-03-08 MED ORDER — MEPERIDINE HCL 25 MG/ML IJ SOLN: 6.2500 mg | INTRAMUSCULAR | Status: DC | PRN

## 2017-03-08 MED ORDER — BUPIVACAINE HCL (PF) 0.25 % IJ SOLN
INTRAMUSCULAR | Status: AC
  Filled 2017-03-08: qty 30

## 2017-03-08 MED ORDER — DEXAMETHASONE SODIUM PHOSPHATE 4 MG/ML IJ SOLN
INTRAMUSCULAR | Status: DC | PRN
  Administered 2017-03-08: 4 mg via INTRAVENOUS

## 2017-03-08 MED ORDER — NALBUPHINE HCL 10 MG/ML IJ SOLN: 5.0000 mg | INTRAMUSCULAR | Status: DC | PRN

## 2017-03-08 MED ORDER — OXYCODONE-ACETAMINOPHEN 5-325 MG PO TABS: 2.0000 | ORAL_TABLET | ORAL | Status: DC | PRN

## 2017-03-08 MED ORDER — DIBUCAINE 1 % RE OINT: 1.0000 "application " | TOPICAL_OINTMENT | RECTAL | Status: DC | PRN

## 2017-03-08 MED ORDER — ONDANSETRON HCL 4 MG/2ML IJ SOLN
INTRAMUSCULAR | Status: DC | PRN
  Administered 2017-03-08: 4 mg via INTRAVENOUS

## 2017-03-08 MED ORDER — BUPIVACAINE IN DEXTROSE 0.75-8.25 % IT SOLN
INTRATHECAL | Status: DC | PRN
  Administered 2017-03-08: 1.2 mL via INTRATHECAL

## 2017-03-08 MED ORDER — LACTATED RINGERS IV SOLN
INTRAVENOUS | Status: DC | PRN
  Administered 2017-03-08 (×2): via INTRAVENOUS

## 2017-03-08 MED ORDER — SOD CITRATE-CITRIC ACID 500-334 MG/5ML PO SOLN
ORAL | Status: AC
  Filled 2017-03-08: qty 15

## 2017-03-08 MED ORDER — IBUPROFEN 600 MG PO TABS
600.0000 mg | ORAL_TABLET | Freq: Four times a day (QID) | ORAL | Status: DC
  Administered 2017-03-08 – 2017-03-10 (×7): 600 mg via ORAL
  Filled 2017-03-08 (×8): qty 1

## 2017-03-08 MED ORDER — TETANUS-DIPHTH-ACELL PERTUSSIS 5-2.5-18.5 LF-MCG/0.5 IM SUSP: 0.5000 mL | Freq: Once | INTRAMUSCULAR | Status: DC

## 2017-03-08 MED ORDER — MORPHINE SULFATE (PF) 0.5 MG/ML IJ SOLN
INTRAMUSCULAR | Status: DC | PRN
  Administered 2017-03-08: .2 mg via INTRATHECAL

## 2017-03-08 MED ORDER — SENNOSIDES-DOCUSATE SODIUM 8.6-50 MG PO TABS
2.0000 | ORAL_TABLET | ORAL | Status: DC
  Administered 2017-03-08 – 2017-03-09 (×2): 2 via ORAL
  Filled 2017-03-08 (×2): qty 2

## 2017-03-08 SURGICAL SUPPLY — 40 items
BENZOIN TINCTURE PRP APPL 2/3 (GAUZE/BANDAGES/DRESSINGS) ×3 IMPLANT
CHLORAPREP W/TINT 26ML (MISCELLANEOUS) ×3 IMPLANT
CLAMP CORD UMBIL (MISCELLANEOUS) IMPLANT
CLOSURE STERI STRIP 1/2 X4 (GAUZE/BANDAGES/DRESSINGS) ×2 IMPLANT
CLOSURE WOUND 1/2 X4 (GAUZE/BANDAGES/DRESSINGS) ×1
CLOTH BEACON ORANGE TIMEOUT ST (SAFETY) ×3 IMPLANT
DECANTER SPIKE VIAL GLASS SM (MISCELLANEOUS) ×6 IMPLANT
DRSG OPSITE POSTOP 4X10 (GAUZE/BANDAGES/DRESSINGS) ×3 IMPLANT
ELECT REM PT RETURN 9FT ADLT (ELECTROSURGICAL) ×3
ELECTRODE REM PT RTRN 9FT ADLT (ELECTROSURGICAL) ×1 IMPLANT
EXTRACTOR VACUUM BELL STYLE (SUCTIONS) IMPLANT
EXTRACTOR VACUUM KIWI (MISCELLANEOUS) IMPLANT
GLOVE BIO SURGEON STRL SZ 6.5 (GLOVE) ×2 IMPLANT
GLOVE BIO SURGEONS STRL SZ 6.5 (GLOVE) ×1
GLOVE BIOGEL PI IND STRL 7.0 (GLOVE) ×2 IMPLANT
GLOVE BIOGEL PI INDICATOR 7.0 (GLOVE) ×4
GOWN STRL REUS W/TWL LRG LVL3 (GOWN DISPOSABLE) ×9 IMPLANT
KIT ABG SYR 3ML LUER SLIP (SYRINGE) IMPLANT
NEEDLE HYPO 22GX1.5 SAFETY (NEEDLE) IMPLANT
NEEDLE HYPO 25X5/8 SAFETYGLIDE (NEEDLE) IMPLANT
NS IRRIG 1000ML POUR BTL (IV SOLUTION) ×3 IMPLANT
PACK C SECTION WH (CUSTOM PROCEDURE TRAY) ×3 IMPLANT
PAD ABD 7.5X8 STRL (GAUZE/BANDAGES/DRESSINGS) ×6 IMPLANT
PAD OB MATERNITY 4.3X12.25 (PERSONAL CARE ITEMS) ×3 IMPLANT
PENCIL SMOKE EVAC W/HOLSTER (ELECTROSURGICAL) ×3 IMPLANT
RTRCTR C-SECT PINK 25CM LRG (MISCELLANEOUS) ×3 IMPLANT
STRIP CLOSURE SKIN 1/2X4 (GAUZE/BANDAGES/DRESSINGS) ×2 IMPLANT
SUT CHROMIC 2 0 CT 1 (SUTURE) ×6 IMPLANT
SUT MNCRL 0 VIOLET CTX 36 (SUTURE) ×2 IMPLANT
SUT MONOCRYL 0 CTX 36 (SUTURE) ×4
SUT PDS AB 0 CTX 36 PDP370T (SUTURE) IMPLANT
SUT PLAIN 2 0 (SUTURE)
SUT PLAIN ABS 2-0 CT1 27XMFL (SUTURE) IMPLANT
SUT VIC AB 0 CTX 36 (SUTURE) ×4
SUT VIC AB 0 CTX36XBRD ANBCTRL (SUTURE) ×2 IMPLANT
SUT VIC AB 2-0 CT1 (SUTURE) ×3 IMPLANT
SUT VIC AB 4-0 KS 27 (SUTURE) ×3 IMPLANT
SYR CONTROL 10ML LL (SYRINGE) IMPLANT
TOWEL OR 17X24 6PK STRL BLUE (TOWEL DISPOSABLE) ×3 IMPLANT
TRAY FOLEY BAG SILVER LF 14FR (SET/KITS/TRAYS/PACK) ×3 IMPLANT

## 2017-03-08 NOTE — H&P (Signed)
Shelby Hahn is a 31 y.o. female G3P2 at 27 weeks 1 day presenting for repeat cesarean delivery.  Prenatal course uncomplicated. She denies CTX, no VB, no LOF, reports good FM.   OB History    Gravida Para Term Preterm AB Living   3 2 2  0 0 2   SAB TAB Ectopic Multiple Live Births   0 0 0 0 2     Past Medical History:  Diagnosis Date  . Bursitis of left hip    History   Past Surgical History:  Procedure Laterality Date  . CESAREAN SECTION  06/2009  . CESAREAN SECTION  06/18/2011   Procedure: CESAREAN SECTION;  Surgeon: Caprice Beaver;  Location: [facility] ORS;  Service: Gynecology;  Laterality: N/A;  . EYE SURGERY     bilateral eye surgery at age 1 yrs  . WISDOM TOOTH EXTRACTION     Family History: family history is not on file. Social History:  reports that she has never smoked. She has never used smokeless tobacco. She reports that she does not drink alcohol or use drugs.     Maternal Diabetes: No Genetic Screening: Normal Maternal Ultrasounds/Referrals: Normal Fetal Ultrasounds or other Referrals:  None Maternal Substance Abuse:  No Significant Maternal Medications:  None Significant Maternal Lab Results:  Lab values include: Group B Strep positive Other Comments:  None  ROS  As per HPI Maternal Medical History:  Contractions: Frequency: rare.    Fetal activity: Perceived fetal activity is normal.   Last perceived fetal movement was within the past hour.    Prenatal complications: no prenatal complications Prenatal Complications - Diabetes: none.      Blood pressure 117/62, pulse 93, temperature 98.3 F (36.8 C), temperature source Oral, resp. rate 18, height 5\' 2"  (1.575 m), weight 74.8 kg (165 lb), SpO2 95 %, unknown if currently breastfeeding. Maternal Exam:  Uterine Assessment: Contraction frequency is rare.   Abdomen: Fundal height is 40 cm.   Estimated fetal weight is 3800 grams.    Introitus: Ferning test: not done.  Nitrazine test: not done. Amniotic  fluid character: not assessed.     Fetal Exam Fetal Monitor Review: Baseline rate: 145.  Variability: moderate (6-25 bpm).   Pattern: accelerations present and no decelerations.    Fetal State Assessment: Category I - tracings are normal.     Physical Exam  Prenatal labs: ABO, Rh: --/--/O NEG (08/16 0955) Antibody: POS (08/16 0955) Rubella: Equivocal (02/06 0000) RPR: Non Reactive (08/16 0955)  HBsAg: Negative (02/06 0000)  HIV: Non-reactive (02/06 0000)  GBS: Positive (07/27 0000)   Assessment/Plan: 31 yo G3P2 at 39 weeks 1 day for elective repeat c-section  Ancef on call to OR   Shelby Hahn 03/08/2017, 11:30 AM

## 2017-03-08 NOTE — Anesthesia Postprocedure Evaluation (Signed)
Anesthesia Post Note  Patient: Cintia Timoteo Ace  Procedure(s) Performed: Procedure(s) (LRB): CESAREAN SECTION (N/A)     Patient location during evaluation: Mother Baby Anesthesia Type: Spinal Level of consciousness: awake and alert and oriented Pain management: pain level controlled Vital Signs Assessment: post-procedure vital signs reviewed and stable Respiratory status: spontaneous breathing and nonlabored ventilation Cardiovascular status: stable Postop Assessment: no headache, patient able to bend at knees, no backache, no signs of nausea or vomiting, spinal receding and adequate PO intake Anesthetic complications: no    Last Vitals:  Vitals:   03/08/17 1435 03/08/17 1450  BP: 103/74 (!) 121/52  Pulse: 82 71  Resp: 12 20  Temp: 36.7 C 36.7 C  SpO2: 100% 97%    Last Pain:  Vitals:   03/08/17 1450  TempSrc: Oral   Pain Goal: Patients Stated Pain Goal: 5 (03/08/17 1345)               Helmut Hennon Hristova

## 2017-03-08 NOTE — Anesthesia Procedure Notes (Signed)
Spinal  Patient location during procedure: OR End time: 03/08/2017 12:15 PM Staffing Anesthesiologist: Jairo Ben Performed: anesthesiologist  Preanesthetic Checklist Completed: patient identified, surgical consent, pre-op evaluation, timeout performed, IV checked, risks and benefits discussed and monitors and equipment checked Spinal Block Patient position: sitting Prep: site prepped and draped and DuraPrep Patient monitoring: blood pressure, continuous pulse ox, cardiac monitor and heart rate Approach: midline Location: L3-4 Injection technique: single-shot Needle Needle type: Pencan  Needle gauge: 24 G Needle length: 9 cm Additional Notes Pt identified in Operating room.  Monitors applied. Working IV access confirmed. Sterile prep, drape lumbar spine.  1% lido local L 2,3, unable to enter: os.  Repeat local L 3,4 and #24 ga Pencan into clear CSF L 3,4.  10.5 mg 0.75% Bupivacaine with dextrose, morphine, fentanyl injected with asp CSF beginning and end of injection.  Patient asymptomatic, VSS, no heme aspirated, tolerated well.  Sandford Craze, MD

## 2017-03-08 NOTE — Plan of Care (Signed)
Problem: Education: Goal: Knowledge of condition will improve Outcome: Progressing Educated patient about bleeding and cramping and how to use the incentive spirometer and the importance of.  Also educated patient to wear a bra once up and ambulating to limit stimulation to the breasts since she is formula feeding only.

## 2017-03-08 NOTE — Addendum Note (Signed)
Addendum  created 03/08/17 1553 by Elgie Congo, CRNA   Sign clinical note

## 2017-03-08 NOTE — Anesthesia Postprocedure Evaluation (Signed)
Anesthesia Post Note  Patient: Shelby Hahn  Procedure(s) Performed: Procedure(s) (LRB): CESAREAN SECTION (N/A)     Anesthesia Type: Spinal Level of consciousness: awake and alert, patient cooperative and oriented Pain management: pain level controlled Vital Signs Assessment: post-procedure vital signs reviewed and stable Respiratory status: nonlabored ventilation, spontaneous breathing and respiratory function stable Cardiovascular status: blood pressure returned to baseline and stable Postop Assessment: patient able to bend at knees, spinal receding and no signs of nausea or vomiting Anesthetic complications: no    Last Vitals:  Vitals:   03/08/17 1410 03/08/17 1450  BP:  (!) 121/52  Pulse: 89 71  Resp: 19 20  Temp:  36.7 C  SpO2: 92% 97%    Last Pain:  Vitals:   03/08/17 1450  TempSrc: Oral   Pain Goal: Patients Stated Pain Goal: 5 (03/08/17 1345)               Denece Shearer,E. Demeisha Geraghty

## 2017-03-08 NOTE — Plan of Care (Signed)
Problem: Pain Management: Goal: General experience of comfort will improve and pain level will decrease Outcome: Progressing Patient states that she is not having any pain at this time.

## 2017-03-08 NOTE — Anesthesia Preprocedure Evaluation (Addendum)
Anesthesia Evaluation  Patient identified by MRN, date of birth, ID band Patient awake    Reviewed: Allergy & Precautions, NPO status , Patient's Chart, lab work & pertinent test results  History of Anesthesia Complications Negative for: history of anesthetic complications  Airway Mallampati: II  TM Distance: >3 FB Neck ROM: Full    Dental  (+) Dental Advisory Given   Pulmonary neg pulmonary ROS,    breath sounds clear to auscultation       Cardiovascular negative cardio ROS   Rhythm:Regular Rate:Normal     Neuro/Psych negative neurological ROS     GI/Hepatic negative GI ROS, Neg liver ROS,   Endo/Other  negative endocrine ROS  Renal/GU negative Renal ROS     Musculoskeletal   Abdominal   Peds  Hematology plt 186k   Anesthesia Other Findings   Reproductive/Obstetrics (+) Pregnancy Repeat C-section                            Anesthesia Physical Anesthesia Plan  ASA: II  Anesthesia Plan: Spinal   Post-op Pain Management:    Induction:   PONV Risk Score and Plan: 3 and Ondansetron, Treatment may vary due to age or medical condition and Scopolamine patch - Pre-op  Airway Management Planned: Natural Airway  Additional Equipment:   Intra-op Plan:   Post-operative Plan:   Informed Consent: I have reviewed the patients History and Physical, chart, labs and discussed the procedure including the risks, benefits and alternatives for the proposed anesthesia with the patient or authorized representative who has indicated his/her understanding and acceptance.   Dental advisory given  Plan Discussed with: CRNA and Surgeon  Anesthesia Plan Comments: (Plan routine monitors, SAB)        Anesthesia Quick Evaluation

## 2017-03-08 NOTE — Brief Op Note (Signed)
03/08/2017  1:36 PM  PATIENT:  Amedeo Kinsman  31 y.o. female  PRE-OPERATIVE DIAGNOSIS:  REPEAT EDD: 03/14/2017 NKDA  POST-OPERATIVE DIAGNOSIS:  REPEAT EDD: 03/14/2017  PROCEDURE:  Procedure(s): CESAREAN SECTION (N/A)  SURGEON:  Surgeon(s) and Role:    * Essie Hart, MD - Primary   ASSISTANTS: Marlow Baars, MD   ANESTHESIA:   local and spinal  EBL:  Total I/O In: 2000 [I.V.:2000] Out: 980 [Urine:200; Blood:780]  BLOOD ADMINISTERED:none  DRAINS: Urinary Catheter (Foley)   LOCAL MEDICATIONS USED:  MARCAINE    and Amount: 30 ml  SPECIMEN:  No Specimen  DISPOSITION OF SPECIMEN:  N/A  COUNTS:  YES  TOURNIQUET:  * No tourniquets in log *  DICTATION: .Note written in EPIC  PLAN OF CARE: Admit to inpatient   PATIENT DISPOSITION:  PACU - hemodynamically stable.   Delay start of Pharmacological VTE agent (>24hrs) due to surgical blood loss or risk of bleeding: not applicable

## 2017-03-08 NOTE — Transfer of Care (Signed)
Immediate Anesthesia Transfer of Care Note  Patient: Shelby Hahn  Procedure(s) Performed: Procedure(s): CESAREAN SECTION (N/A)  Patient Location: PACU  Anesthesia Type:Spinal  Level of Consciousness: awake, alert  and oriented  Airway & Oxygen Therapy: Patient Spontanous Breathing  Post-op Assessment: Report given to RN and Post -op Vital signs reviewed and stable  Post vital signs: Reviewed and stable  Last Vitals:  Vitals:   03/08/17 1036  BP: 117/62  Pulse: 93  Resp: 18  Temp: 36.8 C  SpO2: 95%    Last Pain:  Vitals:   03/08/17 1036  TempSrc: Oral      Patients Stated Pain Goal: 5 (03/08/17 1039)  Complications: No apparent anesthesia complications

## 2017-03-08 NOTE — Op Note (Signed)
Cesarean Section Procedure Note  Indications: previous uterine incision kerr x 2  Pre-operative Diagnosis: 39 week 1 day pregnancy, 2 prior Low transverse cesarean             Sections  Post-operative Diagnosis: same  Surgeon: Wynonia Hazard   Assistants: Marlow Baars  Anesthesia: Spinal anesthesia  ASA Class: 2  Procedure Details   The patient was counseled about the risks, benefits, complications of the cesarean section. The patient concurred with the proposed plan, giving informed consent.  The site of surgery properly noted/marked. The patient was taken to Operating Room # 9, identified as Shelby Hahn and the procedure verified as C-Section Delivery. A Time Out was held and the above information confirmed.  After spinal was found to adequate , the patient was placed in the dorsal supine position with a leftward tilt, draped and prepped in the usual sterile manner. A Pfannenstiel incision was made and carried down through the subcutaneous tissue to the fascia.  The fascia was incised in the midline and the fascial incision was extended laterally with Mayo scissors. There was no visible rectus muscle or peritoneum present. After excising the fascial layer the uterus was exposed. Using Metzenbaum scissors, the vesicouterine peritoneum was identified, tented up, and entered sharply.  The bladder was adherent to the lower uterine segment, so it was carefully dissected off the lower uterine segment and gently pushed down with a moist lap.  The bladder flap was created digitally. The Balfour bladder blade was placed posteriorly and the Richardson retractor anteriorly.  The 10 blade scalpel was then used to make a low transverse incision on the uterus which was extended laterally with  blunt dissection. The fetal vertex was identified, and the head was then delivered easily through the uterine incision followed by the body. The A live female infant was bulb suctioned on the operative field  cried vigorously, cord was clamped and cut and the infant was passed to the waiting neonatologist. Apgars 9/9. Placenta was then delivered spontaneously, intact and appeared normal, the uterus was cleared of all clot and debris. The uterine incision was repaired with #0 Monocryl in running locked fashion. A second imbricating suture was performed using the same suture. The incision was hemostatic. Ovaries and tubes were inspected and normal. The abdominal cavity was cleared of all clot and debris. The abdominal peritoneum was reapproximated with 2-0 vicryl  in a running fashion. The scarcely visible rectus muscles was reapproximated with #2 vicryl in running fashion, incorporating some of the underlying peritoneum. The fascia was closed with 0 PDS in a running fashion starting from each end and meeting in the middle. The subcuticular layer was irrigated and all bleeders cauterized.    The incision was injected with 30 mL of 0.25% Marcaine. Interrupted sutures of 2-0 plain were used to re-approximate Scarpas fascia. The skin was closed with 4-0 vicryl in a subcuticular fashion using a Mellody Dance needle. The incision was dressed with benzoine, steri strips and pressure dressing. All sponge lap and needle counts were correct x3. Patient tolerated the procedure well and recovered in stable condition following the procedure.  Instrument, sponge, and needle counts were correct prior the abdominal closure and at the conclusion of the case.   Findings: Live female infant, Apgars 9/9, clear amniotic fluid, normal appearing placenta, normal uterus, bilateral tubes and ovaries  Estimated Blood Loss:  IVF:         Drains: Foley catheter  Urine output: 200 mL clear  urine         Specimens: Placenta to L&D         Implants: none         Complications:  None; patient tolerated the procedure well.         Disposition: PACU - hemodynamically stable.   Shelby Hahn

## 2017-03-09 LAB — CBC
HCT: 29.5 % — ABNORMAL LOW (ref 36.0–46.0)
Hemoglobin: 10 g/dL — ABNORMAL LOW (ref 12.0–15.0)
MCH: 31.4 pg (ref 26.0–34.0)
MCHC: 33.9 g/dL (ref 30.0–36.0)
MCV: 92.8 fL (ref 78.0–100.0)
Platelets: 154 10*3/uL (ref 150–400)
RBC: 3.18 MIL/uL — ABNORMAL LOW (ref 3.87–5.11)
RDW: 14.7 % (ref 11.5–15.5)
WBC: 12.1 10*3/uL — ABNORMAL HIGH (ref 4.0–10.5)

## 2017-03-09 LAB — BIRTH TISSUE RECOVERY COLLECTION (PLACENTA DONATION)

## 2017-03-09 MED ORDER — RHO D IMMUNE GLOBULIN 1500 UNIT/2ML IJ SOSY
300.0000 ug | PREFILLED_SYRINGE | Freq: Once | INTRAMUSCULAR | Status: AC
  Administered 2017-03-09: 300 ug via INTRAVENOUS
  Filled 2017-03-09: qty 2

## 2017-03-09 NOTE — Progress Notes (Signed)
POD#1 Pt without complaints. Lochia wnl IMP/stable Plan/ Routine care

## 2017-03-09 NOTE — Progress Notes (Signed)
Assisted patient to bathroom, removed foley with output. Patient ambulated well and denied dizziness. Informed patient to call out next time she uses the restroom

## 2017-03-10 LAB — RH IG WORKUP (INCLUDES ABO/RH)
ABO/RH(D): O NEG
Fetal Screen: NEGATIVE
GESTATIONAL AGE(WKS): 39.1
UNIT DIVISION: 0

## 2017-03-10 MED ORDER — IBUPROFEN 600 MG PO TABS: 600.0000 mg | ORAL_TABLET | Freq: Four times a day (QID) | ORAL | 0 refills | Status: DC

## 2017-03-10 MED ORDER — OXYCODONE-ACETAMINOPHEN 5-325 MG PO TABS: 1.0000 | ORAL_TABLET | ORAL | 0 refills | Status: DC | PRN

## 2017-03-10 NOTE — Progress Notes (Signed)
Rubella equivicol to- MMR info is given to pt and discussed. Pt declines the MMR.

## 2017-03-10 NOTE — Discharge Summary (Signed)
Obstetric Discharge Summary Reason for Admission: cesarean section Prenatal Procedures: ultrasound Intrapartum Procedures: cesarean: low cervical, transverse Postpartum Procedures: none Complications-Operative and Postpartum: none Hemoglobin  Date Value Ref Range Status  03/09/2017 10.0 (L) 12.0 - 15.0 g/dL Final   HCT  Date Value Ref Range Status  03/09/2017 29.5 (L) 36.0 - 46.0 % Final    Physical Exam:  General: alert Lochia: appropriate Uterine Fundus: firm Incision: healing well DVT Evaluation: No evidence of DVT seen on physical exam.  Discharge Diagnoses: Term Pregnancy-delivered  Discharge Information: Date: 03/10/2017 Activity: pelvic rest Diet: routine Medications: PNV, Ibuprofen and Percocet Condition: stable Instructions: refer to practice specific booklet Discharge to: home Follow-up Information    Essie Hart, MD. Schedule an appointment as soon as possible for a visit in 1 week(s).   Specialty:  Obstetrics and Gynecology Contact information: [address] [place] [address] [phone]           Newborn Data: Live born female  Birth Weight: 9 lb 1.7 oz (4130 g) APGAR: 9, 9  Home with mother.  ANDERSON,MARK E 03/10/2017, 9:05 AM

## 2017-03-10 NOTE — Progress Notes (Signed)
POD#2 Pt without complaints. States lochia wnl VSSAF IMP/ doing well Plan/ Will discharge

## 2017-03-11 LAB — BPAM RBC
BLOOD PRODUCT EXPIRATION DATE: 201809012359
Blood Product Expiration Date: 201809052359
UNIT TYPE AND RH: 9500
UNIT TYPE AND RH: 9500

## 2017-03-11 LAB — TYPE AND SCREEN
ABO/RH(D): O NEG
Antibody Screen: POSITIVE
DAT, IgG: NEGATIVE
Unit division: 0
Unit division: 0

## 2019-06-22 DIAGNOSIS — Z20828 Contact with and (suspected) exposure to other viral communicable diseases: Secondary | ICD-10-CM | POA: Diagnosis not present

## 2019-06-23 HISTORY — PX: OTHER SURGICAL HISTORY: SHX169

## 2020-01-19 DIAGNOSIS — M436 Torticollis: Secondary | ICD-10-CM | POA: Diagnosis not present

## 2020-01-28 DIAGNOSIS — G243 Spasmodic torticollis: Secondary | ICD-10-CM | POA: Diagnosis not present

## 2020-02-15 DIAGNOSIS — G243 Spasmodic torticollis: Secondary | ICD-10-CM | POA: Diagnosis not present

## 2020-02-24 ENCOUNTER — Encounter: Payer: Self-pay | Admitting: Physical Medicine & Rehabilitation

## 2020-02-24 ENCOUNTER — Encounter
Payer: BC Managed Care – PPO | Attending: Physical Medicine & Rehabilitation | Admitting: Physical Medicine & Rehabilitation

## 2020-02-24 ENCOUNTER — Other Ambulatory Visit: Payer: Self-pay

## 2020-02-24 DIAGNOSIS — G243 Spasmodic torticollis: Secondary | ICD-10-CM | POA: Diagnosis not present

## 2020-02-24 NOTE — Patient Instructions (Signed)
PLEASE FEEL FREE TO CALL OUR OFFICE WITH ANY PROBLEMS OR QUESTIONS ([phone])      

## 2020-02-24 NOTE — Progress Notes (Signed)
Subjective:    Patient ID: Shelby Hahn, female    DOB: Nov 09, 1985, 34 y.o.   MRN: [id number]  HPI   This is initial visit for [facility] for assessment and potential botulinum toxin treatment for cervical dystonia.  This is a pleasant 34 year old female who developed idiopathic onset of neck pain and spasms a few months ago.  Apparently it happened after she woke from bed 1 morning.  About 2 months ago pain became more severe.  She has seen neurology through [facility] health system as well as some other providers apparently.  She was diagnosed with cervical dystonia and was treated initially with antispasmodics including baclofen, clonazepam, and Valium.  Valium did provide some relief but made her very sleepy.  She has 3 young children that she is raising.  Botulinum toxin was raised as an option and the patient is here for botulinum toxin injections today.  Patient reports that for the most part her head rotates to the left and there is some extension of the neck as well associated with this although the primary movements is rotation.  She is able to reduce the neck to neutral using her hands or another object to stabilize the neck.  By putting her head back against the chair rest of her car she is able to drive.  She is tried using a soft cervical collar but the collar did not do much for her.   Pain Inventory Average Pain 8 Pain Right Now 6 My pain is constant and aching  In the last 24 hours, has pain interfered with the following? General activity 7 Relation with others 8 Enjoyment of life 8 What TIME of day is your pain at its worst? daytime Sleep (in general) Fair  Pain is worse with: some activites Pain improves with: heat/ice Relief from Meds: 1  Mobility how many minutes can you walk? No problem walking. ability to climb steps?  yes do you drive?  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 I need assistance with the following:   household duties Do you have any goals in this area?  yes  Neuro/Psych spasms  Prior Studies New Patient  Physicians involved in your care New Patient   History reviewed. No pertinent family history. Social History   Socioeconomic History  . Marital status: Married    Spouse name: Not on file  . Number of children: Not on file  . Years of education: Not on file  . Highest education level: Not on file  Occupational History  . Not on file  Tobacco Use  . Smoking status: Never Smoker  . Smokeless tobacco: Never Used  Vaping Use  . Vaping Use: Never used  Substance and Sexual Activity  . Alcohol use: No  . Drug use: No  . Sexual activity: Not Currently  Other Topics Concern  . Not on file  Social History Narrative  . Not on file   Social Determinants of Health   Financial Resource Strain:   . Difficulty of Paying Living Expenses:   Food Insecurity:   . Worried About Programme researcher, broadcasting/film/video in the Last Year:   . Barista in the Last Year:   Transportation Needs:   . Freight forwarder (Medical):   Marland Kitchen Lack of Transportation (Non-Medical):   Physical Activity:   . Days of Exercise per Week:   . Minutes of Exercise per Session:  Stress:   . Feeling of Stress :   Social Connections:   . Frequency of Communication with Friends and Family:   . Frequency of Social Gatherings with Friends and Family:   . Attends Religious Services:   . Active Member of Clubs or Organizations:   . Attends Banker Meetings:   Marland Kitchen Marital Status:    Past Surgical History:  Procedure Laterality Date  . CESAREAN SECTION  06/2009  . CESAREAN SECTION  06/18/2011   Procedure: CESAREAN SECTION;  Surgeon: Caprice Beaver;  Location: [facility];  Service: Gynecology;  Laterality: N/A;  . CESAREAN SECTION N/A 03/08/2017   Procedure: CESAREAN SECTION;  Surgeon: Essie Hart, MD;  Location: [facility] BIRTHING SUITES;  Service: Obstetrics;  Laterality: N/A;  . EYE SURGERY     bilateral eye  surgery at age 63 yrs  . tummy tuck  06/2019  . WISDOM TOOTH EXTRACTION     Past Medical History:  Diagnosis Date  . Bursitis of left hip    History   BP 124/81   Pulse 80   Temp 97.7 F (36.5 C)   Ht 5\' 2"  (1.575 m)   Wt 55.1 kg   LMP 01/24/2020 (LMP Unknown)   SpO2 95%   BMI 22.20 kg/m   Opioid Risk Score:   Fall Risk Score:  `1  Depression screen PHQ 2/9  Depression screen PHQ 2/9 02/24/2020  Decreased Interest 0  Down, Depressed, Hopeless 1  PHQ - 2 Score 1  Altered sleeping 0  Tired, decreased energy 0  Change in appetite 0  Feeling bad or failure about yourself  0  Trouble concentrating 0  Moving slowly or fidgety/restless 0  Suicidal thoughts 0  PHQ-9 Score 1   Review of Systems  Constitutional: Negative.   HENT: Negative.   Eyes: Negative.   Respiratory: Negative.   Cardiovascular: Negative.   Gastrointestinal: Negative.   Endocrine: Negative.   Musculoskeletal: Positive for back pain and neck pain.  Skin: Negative.   Allergic/Immunologic: Negative.   Neurological: Negative.   Hematological: Negative.   Psychiatric/Behavioral: Negative.   All other systems reviewed and are negative.      Objective:   Physical Exam  Significant spasm in the right sternocleidomastoid with lesser involvement perhaps of the bilateral traps.  Patient can reduce easily to neutral using her hand.  Patient's head tends to bob and almost demonstrates clonic jerk.  Otherwise she is a well-developed young female.  She has no obvious cranial nerve abnormalities.  Cognitively she is intact.  Normal strength and balance as well as coordination are noted.      Assessment & Plan:  Dysport Injection  using needle EMG guidance Indication: Cervical dystonia - Plan: Ambulatory referral to Physical Therapy Right rotational torticollis, mild retrocollis---seems to be primarily mediated by right SCM.  Dilution: 500u/79ml        Total Units Injected: 400 Indication: Severe  spasticity/dystonia which interferes with ADL,mobility and/or  hygiene and is unresponsive to medication management and other conservative care Informed consent was obtained after describing risks and benefits of the procedure with the patient. This includes bleeding, bruising, infection, excessive weakness, or medication side effects. A REMS form is on file and signed.  Needle: 23mm injectable monopolar 25g needle electrode    Number of units per muscle Right SCM 150units along 3 separate access points.  Right trap 100 units 2 access points Right splenius capitis 50 units 1 access points Left trap 75 units 2 access points  Left splenius capitis 25 u one access point 100u was wasted      All injections were done after obtaining appropriate EMG activity and after negative drawback for blood. The patient tolerated the procedure well. Post procedure instructions were given. No follow-ups on file.  Referral was made to outpatient PT at [facility] neuro-rehab

## 2020-02-24 NOTE — Progress Notes (Signed)
Subjective:    Patient ID: Shelby Hahn, female    DOB: 03-Nov-1985, 34 y.o.   MRN: [id number]  HPI Duplicate note  Pain Inventory Average Pain 8 Pain Right Now 6 My pain is constant and aching  In the last 24 hours, has pain interfered with the following? General activity 7 Relation with others 8 Enjoyment of life 8 What TIME of day is your pain at its worst? daytime Sleep (in general) Fair  Pain is worse with: some activites Pain improves with: heat/ice Relief from Meds: 1  Mobility how many minutes can you walk? No problem walking. ability to climb steps?  yes do you drive?  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  yes  Neuro/Psych spasms  Prior Studies New Patient  Physicians involved in your care New Patient   History reviewed. No pertinent family history. Social History   Socioeconomic History  . Marital status: Married    Spouse name: Not on file  . Number of children: Not on file  . Years of education: Not on file  . Highest education level: Not on file  Occupational History  . Not on file  Tobacco Use  . Smoking status: Never Smoker  . Smokeless tobacco: Never Used  Vaping Use  . Vaping Use: Never used  Substance and Sexual Activity  . Alcohol use: No  . Drug use: No  . Sexual activity: Not Currently  Other Topics Concern  . Not on file  Social History Narrative  . Not on file   Social Determinants of Health   Financial Resource Strain:   . Difficulty of Paying Living Expenses:   Food Insecurity:   . Worried About Programme researcher, broadcasting/film/video in the Last Year:   . Barista in the Last Year:   Transportation Needs:   . Freight forwarder (Medical):   Marland Kitchen Lack of Transportation (Non-Medical):   Physical Activity:   . Days of Exercise per Week:   . Minutes of Exercise per Session:   Stress:   . Feeling of  Stress :   Social Connections:   . Frequency of Communication with Friends and Family:   . Frequency of Social Gatherings with Friends and Family:   . Attends Religious Services:   . Active Member of Clubs or Organizations:   . Attends Banker Meetings:   Marland Kitchen Marital Status:    Past Surgical History:  Procedure Laterality Date  . CESAREAN SECTION  06/2009  . CESAREAN SECTION  06/18/2011   Procedure: CESAREAN SECTION;  Surgeon: Caprice Beaver;  Location: [facility];  Service: Gynecology;  Laterality: N/A;  . CESAREAN SECTION N/A 03/08/2017   Procedure: CESAREAN SECTION;  Surgeon: Essie Hart, MD;  Location: [facility] BIRTHING SUITES;  Service: Obstetrics;  Laterality: N/A;  . EYE SURGERY     bilateral eye surgery at age 48 yrs  . tummy tuck  06/2019  . WISDOM TOOTH EXTRACTION     Past Medical History:  Diagnosis Date  . Bursitis of left hip    History   BP 124/81   Pulse 80   Temp 97.7 F (36.5 C)   Ht 5\' 2"  (1.575 m)   Wt 121 lb 6.4 oz (55.1 kg)   LMP 01/24/2020 (LMP Unknown)   SpO2 95%   BMI 22.20 kg/m  Opioid Risk Score:   Fall Risk Score:  `1  Depression screen PHQ 2/9  Depression screen PHQ 2/9 02/24/2020  Decreased Interest 0  Down, Depressed, Hopeless 1  PHQ - 2 Score 1  Altered sleeping 0  Tired, decreased energy 0  Change in appetite 0  Feeling bad or failure about yourself  0  Trouble concentrating 0  Moving slowly or fidgety/restless 0  Suicidal thoughts 0  PHQ-9 Score 1   Review of Systems  Constitutional: Negative.   HENT: Negative.   Eyes: Negative.   Respiratory: Negative.   Cardiovascular: Negative.   Gastrointestinal: Negative.   Endocrine: Negative.   Musculoskeletal: Positive for back pain and neck pain.  Skin: Negative.   Allergic/Immunologic: Negative.   Neurological: Negative.   Hematological: Negative.   Psychiatric/Behavioral: Negative.   All other systems reviewed and are negative.      Objective:   Physical Exam         Assessment & Plan:

## 2020-02-25 ENCOUNTER — Other Ambulatory Visit: Payer: Self-pay | Admitting: Student

## 2020-02-29 ENCOUNTER — Other Ambulatory Visit: Payer: Self-pay | Admitting: Student

## 2020-02-29 DIAGNOSIS — G243 Spasmodic torticollis: Secondary | ICD-10-CM

## 2020-03-03 DIAGNOSIS — M542 Cervicalgia: Secondary | ICD-10-CM | POA: Diagnosis not present

## 2020-03-03 DIAGNOSIS — M546 Pain in thoracic spine: Secondary | ICD-10-CM | POA: Diagnosis not present

## 2020-03-10 DIAGNOSIS — M546 Pain in thoracic spine: Secondary | ICD-10-CM | POA: Diagnosis not present

## 2020-03-10 DIAGNOSIS — M542 Cervicalgia: Secondary | ICD-10-CM | POA: Diagnosis not present

## 2020-03-24 DIAGNOSIS — M542 Cervicalgia: Secondary | ICD-10-CM | POA: Diagnosis not present

## 2020-03-24 DIAGNOSIS — M546 Pain in thoracic spine: Secondary | ICD-10-CM | POA: Diagnosis not present

## 2020-03-25 ENCOUNTER — Ambulatory Visit
Admission: RE | Admit: 2020-03-25 | Discharge: 2020-03-25 | Disposition: A | Discharge status: Home / Self Care | Payer: BC Managed Care – PPO | Source: Ambulatory Visit | Attending: Student | Admitting: Student

## 2020-03-25 ENCOUNTER — Other Ambulatory Visit: Payer: Self-pay

## 2020-03-25 DIAGNOSIS — Q283 Other malformations of cerebral vessels: Secondary | ICD-10-CM | POA: Diagnosis not present

## 2020-03-25 DIAGNOSIS — Q279 Congenital malformation of peripheral vascular system, unspecified: Secondary | ICD-10-CM | POA: Diagnosis not present

## 2020-03-25 DIAGNOSIS — M47812 Spondylosis without myelopathy or radiculopathy, cervical region: Secondary | ICD-10-CM | POA: Diagnosis not present

## 2020-03-25 DIAGNOSIS — G243 Spasmodic torticollis: Secondary | ICD-10-CM

## 2020-03-25 DIAGNOSIS — M2578 Osteophyte, vertebrae: Secondary | ICD-10-CM | POA: Diagnosis not present

## 2020-03-25 IMAGING — MR MR CERVICAL SPINE WO/W CM
4 of 5 series · 28 of 48 positions shown · IV contrast (multihance)
Comparison: None.

CLINICAL DATA: Pain

EXAM:
MRI HEAD WITHOUT AND WITH CONTRAST
MRI CERVICAL SPINE WITHOUT AND WITH CONTRAST
TECHNIQUE: Multiplanar, multiecho pulse sequences of the brain and surrounding
structures, and cervical spine, to include the craniocervical
junction and cervicothoracic junction, were obtained without and
with intravenous contrast.
CONTRAST:  10mL MULTIHANCE GADOBENATE DIMEGLUMINE 529 MG/ML IV SOLN

[Series 8: T1 · sagittal · 3.0mm · 0.82mm/px · 4 of 15 slices shown (1 of 3)]
[im 1/15]
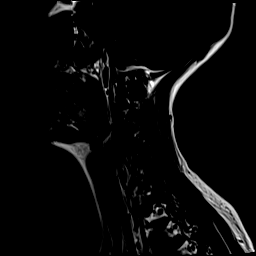
[im 5/15]
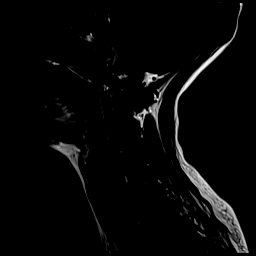
[im 10/15]
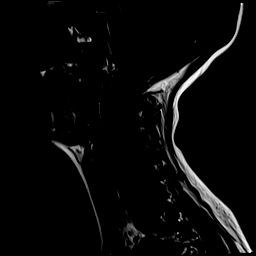
[im 15/15]
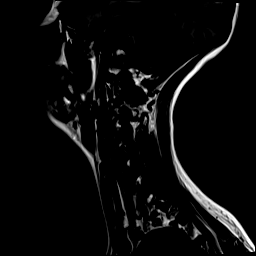

[Series 9: T2 · axial · 3.0mm · 0.62mm/px · z∈[-159,-66]mm · 11 of 30 slices shown]
[im 1/30]
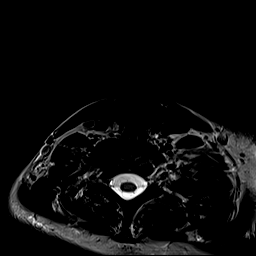
[im 3/30]
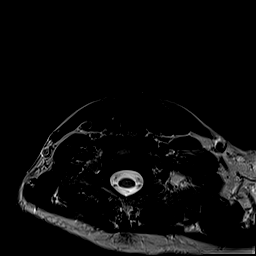
[im 6/30]
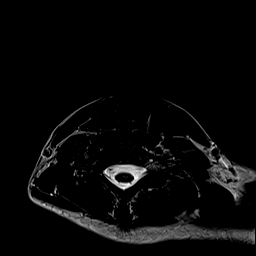
[im 9/30]
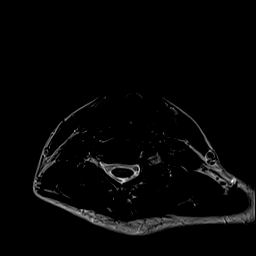
[im 12/30]
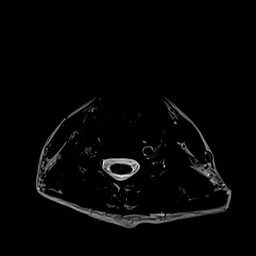
[im 15/30]
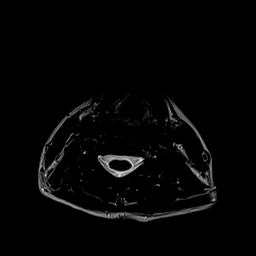
[im 18/30]
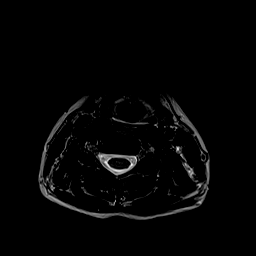
[im 21/30]
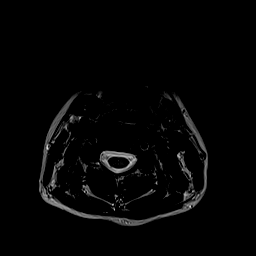
[im 24/30]
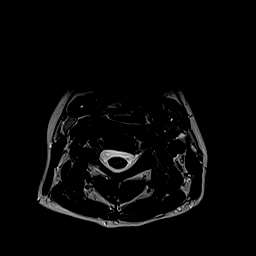
[im 27/30]
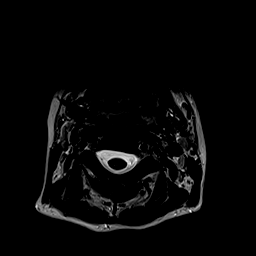
[im 30/30]
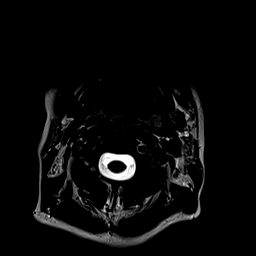

[Series 11: T1 · axial · non-contrast · 3.0mm · 0.31mm/px · z∈[-159,-76]mm · 10 of 30 slices shown (2 of 3)]
[im 1/30]
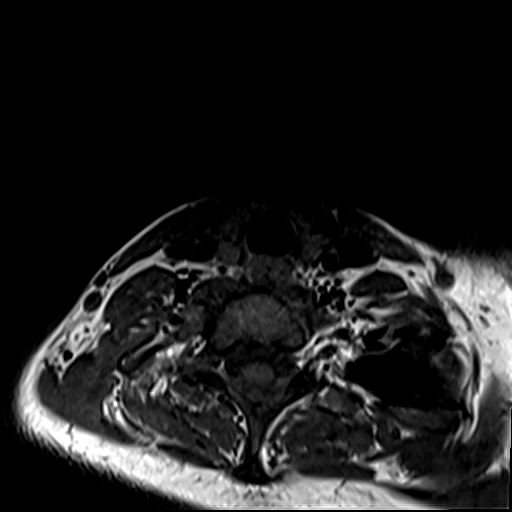
[im 3/30]
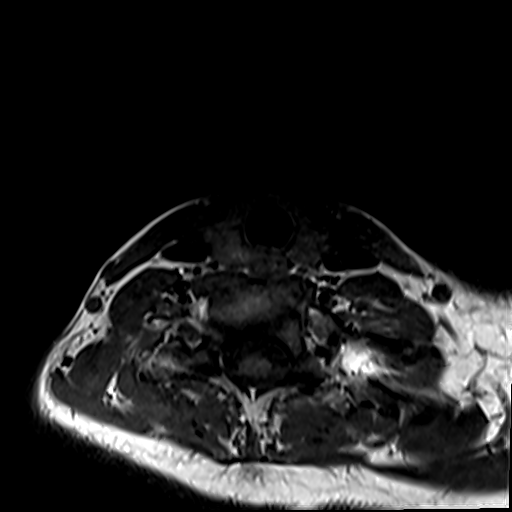
[im 6/30]
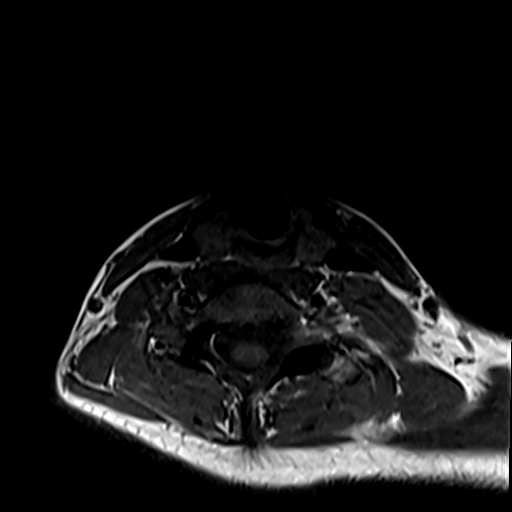
[im 9/30]
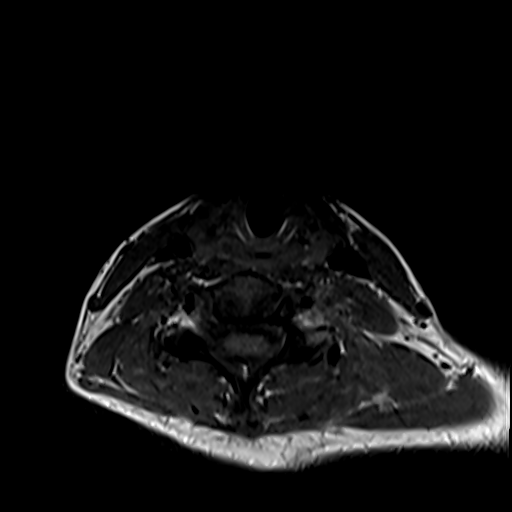
[im 12/30]
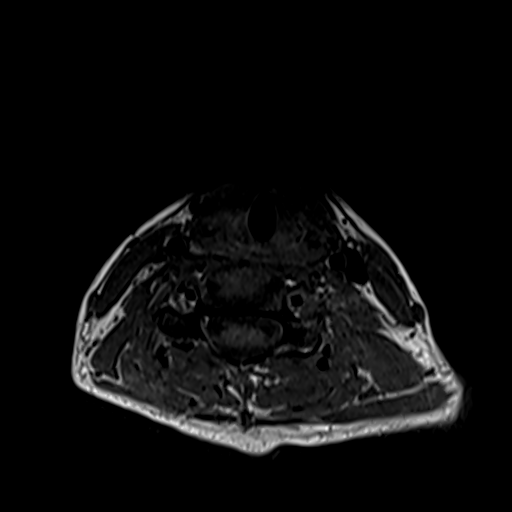
[im 15/30]
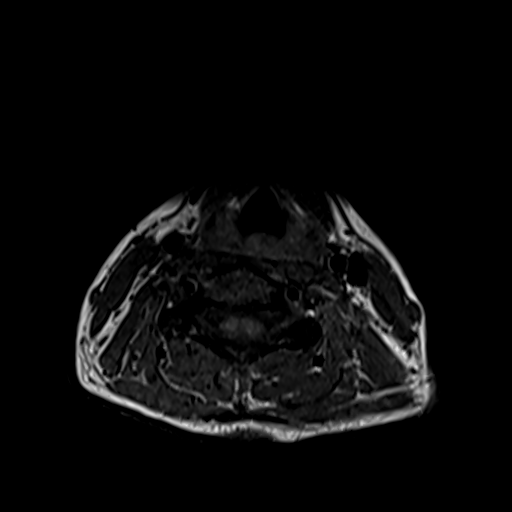
[im 18/30]
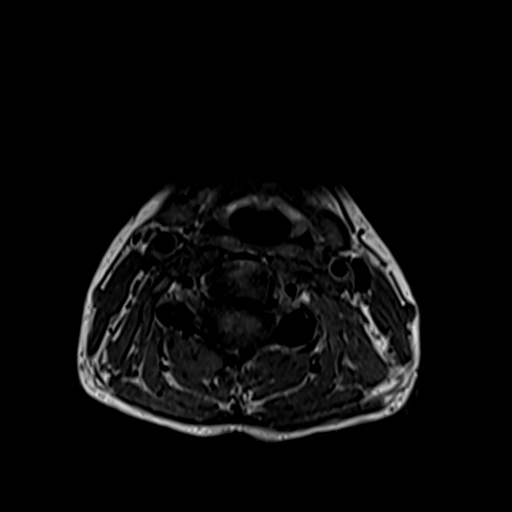
[im 21/30]
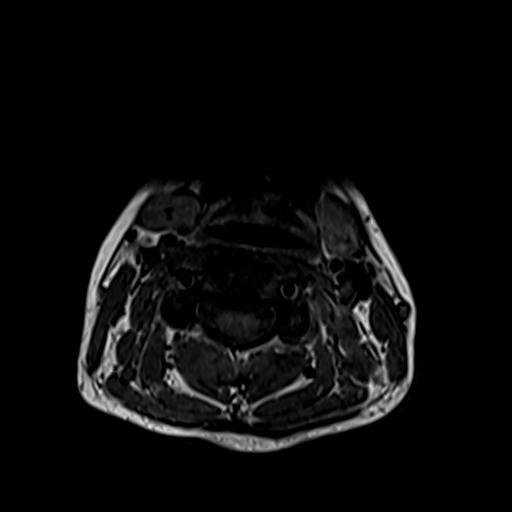
[im 24/30]
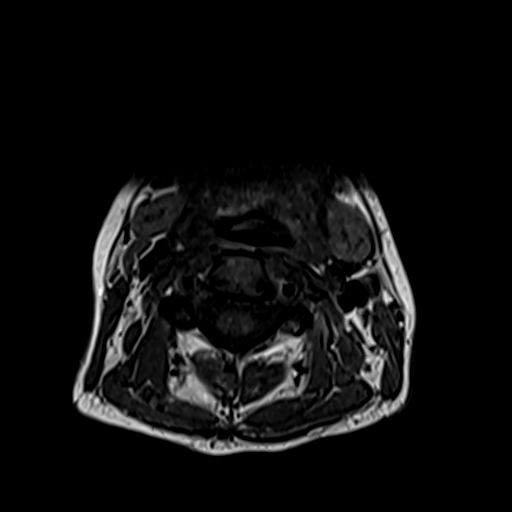
[im 27/30]
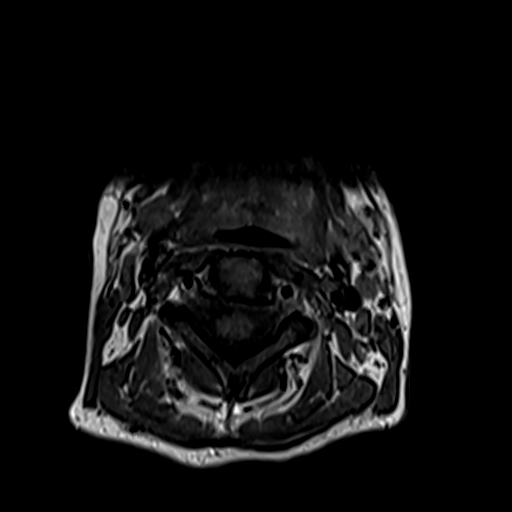

[Series 14: T1 · axial · 3.0mm · 0.31mm/px · z∈[-153,-76]mm · 3 of 30 slices shown (3 of 3)]
[im 3/30]
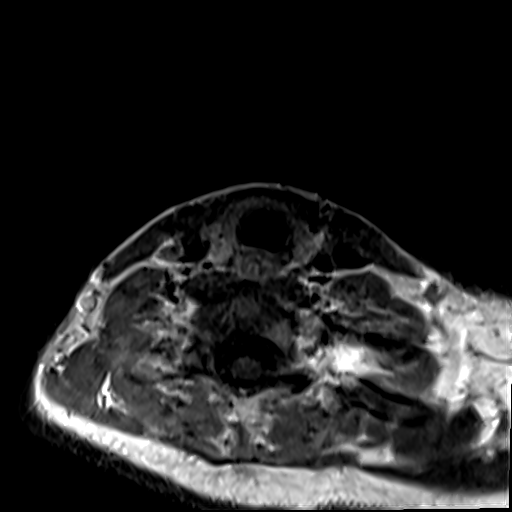
[im 15/30]
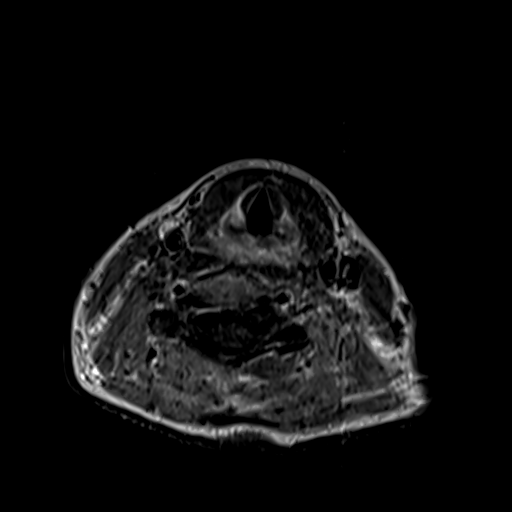
[im 27/30]
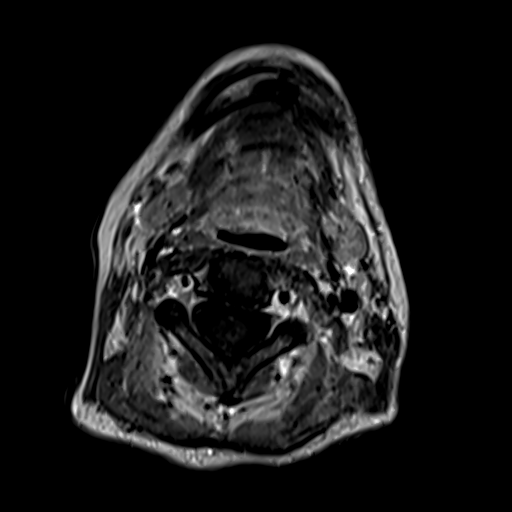

[28 of 48 positions shown; findings below may reference images not displayed]

FINDINGS: Image quality is degraded by motion artifact.

MRI HEAD FINDINGS

Brain: No focal parenchymal signal abnormality. No acute infarct or
intracranial hemorrhage. No midline shift, ventriculomegaly or
extra-axial fluid collection. No mass lesion. No abnormal
enhancement.

Vascular: Normal flow voids. Small medial right occipital and right
vertex developmental venous anomalies.

Skull : Normal marrow signal.

Sinuses/Orbits: Normal orbits. Clear paranasal sinuses. No mastoid
effusion.

Other: None.

MRI CERVICAL SPINE FINDINGS

Alignment: Normal.

Vertebrae: Normal bone marrow signal intensity. No focal osseous
lesion. No abnormal enhancement.

Cord: Normal signal and morphology.

Posterior Fossa, vertebral arteries: Please see MRI head.

Disc levels: Multilevel desiccation however disc spaces are
preserved.

C2-3: No significant disc bulge, spinal canal or neural foraminal
narrowing.

C3-4: No significant disc bulge, spinal canal or neural foraminal
narrowing.

C4-5: No significant disc bulge, spinal canal or neural foraminal
narrowing. Minimal left uncovertebral and facet degenerative
spurring.

C5-6: No significant disc bulge, spinal canal or neural foraminal
narrowing. Annular fissuring along the right lateral aspect of the
disc ([DATE].

C6-7: Small disc osteophyte complex with uncovertebral degenerative
spurring. There is annular fissuring along the left
subarticular/foraminal and extraforaminal aspects of the disc
([DATE]). No focal protrusion. Mild left neural foraminal narrowing.
Patent spinal canal and right neural foramen.

C7-T1: No significant disc bulge, spinal canal or neural foraminal
narrowing.

Paraspinal tissues: Negative.
IMPRESSION: MRI head:

Small developmental venous anomalies involving the right vertex and
medial right occipital lobe.

Otherwise unremarkable MRI brain.

MRI cervical spine:

Annular fissuring along the right lateral C5-6 disc space. Annular
fissuring involving left subarticular to extraforaminal aspects of
the C6-7 disc.

No definite focal protrusion or significant spinal canal narrowing.
Mild left C6-7 neural foraminal narrowing.

Motion degraded examination.

## 2020-03-25 IMAGING — MR MR HEAD WO/W CM
12 series · 48 of 48 positions shown · IV contrast (multihance)
Comparison: None.

CLINICAL DATA: Pain

EXAM:
MRI HEAD WITHOUT AND WITH CONTRAST
MRI CERVICAL SPINE WITHOUT AND WITH CONTRAST
TECHNIQUE: Multiplanar, multiecho pulse sequences of the brain and surrounding
structures, and cervical spine, to include the craniocervical
junction and cervicothoracic junction, were obtained without and
with intravenous contrast.
CONTRAST:  10mL MULTIHANCE GADOBENATE DIMEGLUMINE 529 MG/ML IV SOLN

[Series 5: T1 · sagittal · 4.0mm · 0.94mm/px · 1 of 31 slices shown (1 of 3)]
[im 1/31]
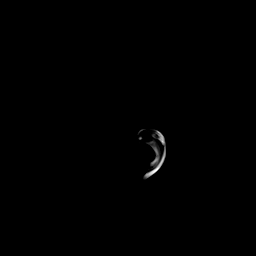

[Series 6: DWI · axial · 3.0mm · 1.44mm/px · z∈[+5,+137]mm · 4 of 84 slices shown (1 of 4)]
[im 1/84]
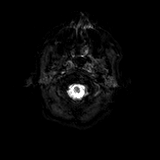
[im 28/84]
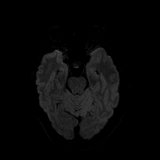
[im 56/84]
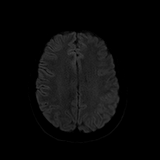
[im 84/84]
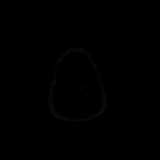

[Series 7: DWI · axial · 3.0mm · 1.44mm/px · z∈[+5,+137]mm · 3 of 42 slices shown (2 of 4)]
[im 1/42]
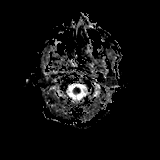
[im 21/42]
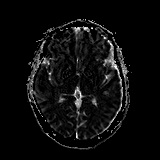
[im 42/42]
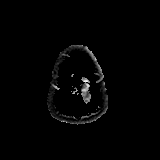

[Series 8: DWI · coronal · 5.0mm · 1.44mm/px · 4 of 60 slices shown (3 of 4)]
[im 1/60]
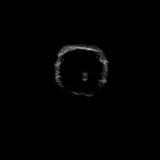
[im 20/60]
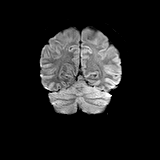
[im 40/60]
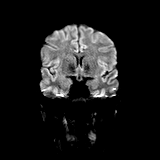
[im 60/60]
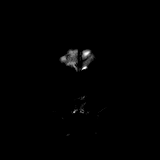

[Series 9: DWI · coronal · 5.0mm · 1.44mm/px · 2 of 30 slices shown (4 of 4)]
[im 1/30]
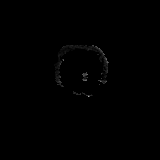
[im 30/30]
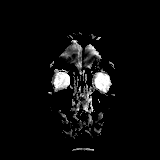

[Series 10: T2 · axial · 4.0mm · 0.36mm/px · z∈[-0,+132]mm · 2 of 27 slices shown]
[im 1/27]
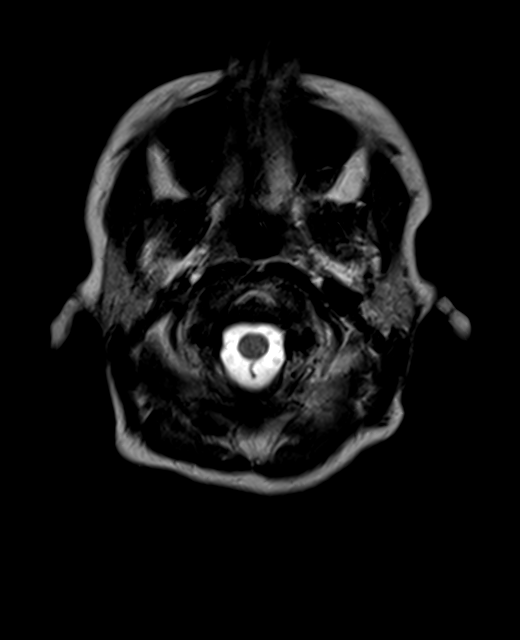
[im 27/27]
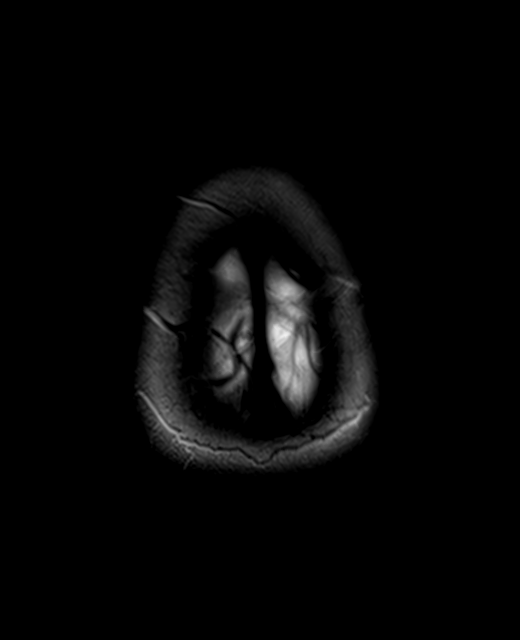

[Series 11: FLAIR · axial · 3.0mm · 0.72mm/px · z∈[-6,+141]mm · 2 of 26 slices shown]
[im 1/26]
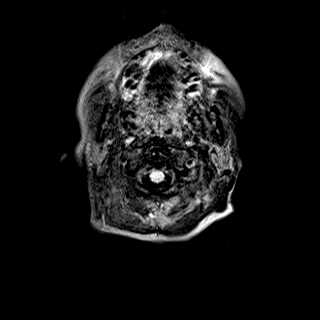
[im 26/26]
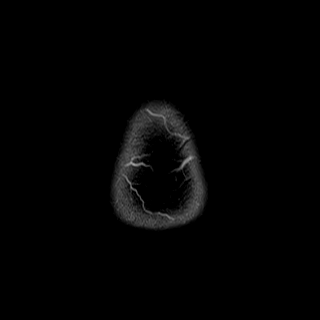

[Series 13: swi_images · axial · 1.5mm · 0.90mm/px · z∈[-4,+135]mm · 6 of 96 slices shown]
[im 1/96]
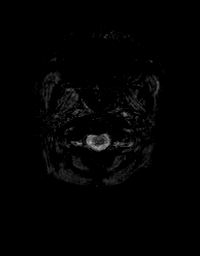
[im 20/96]
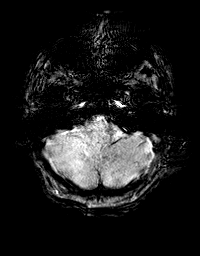
[im 39/96]
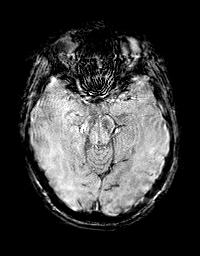
[im 58/96]
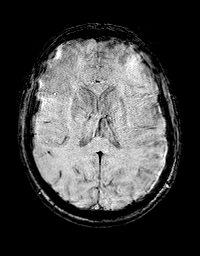
[im 77/96]
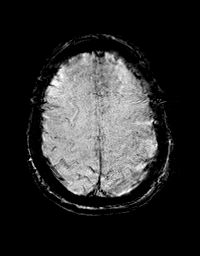
[im 96/96]
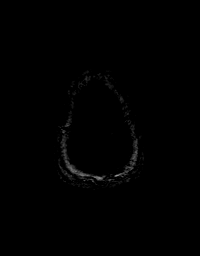

[Series 14: T1 · axial · 1.0mm · 0.94mm/px · z∈[-21,+137]mm · 10 of 160 slices shown (2 of 3)]
[im 1/160]
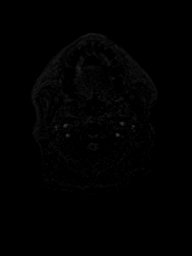
[im 18/160]
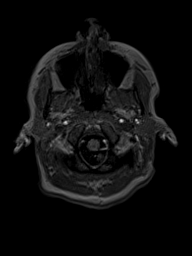
[im 36/160]
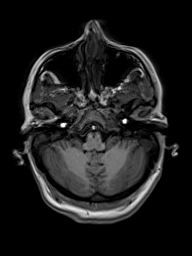
[im 54/160]
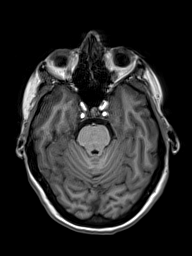
[im 71/160]
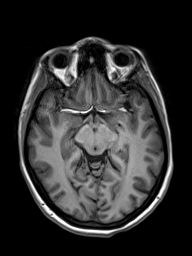
[im 89/160]
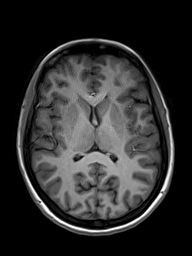
[im 107/160]
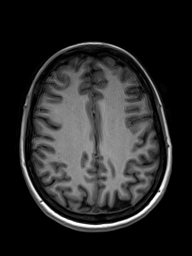
[im 124/160]
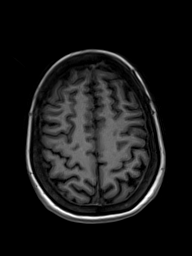
[im 142/160]
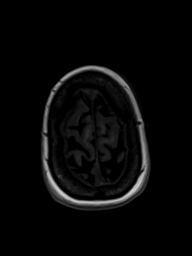
[im 160/160]
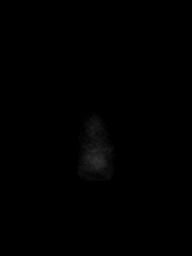

[Series 15: T2 post-contrast · coronal · 4.0mm · 0.36mm/px · 2 of 32 slices shown]
[im 1/32]
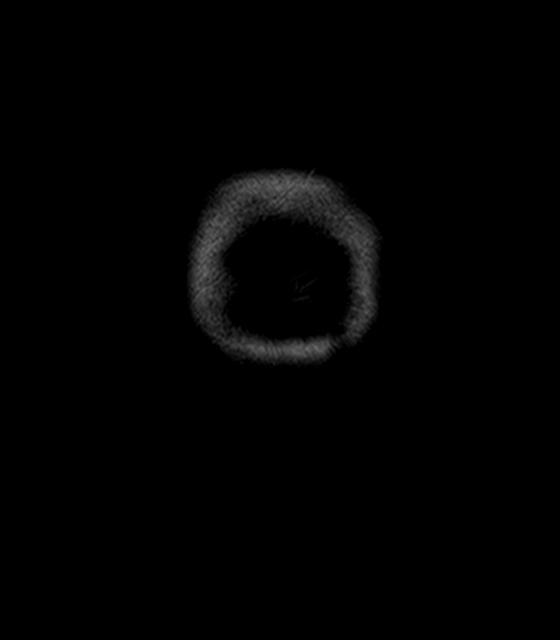
[im 32/32]
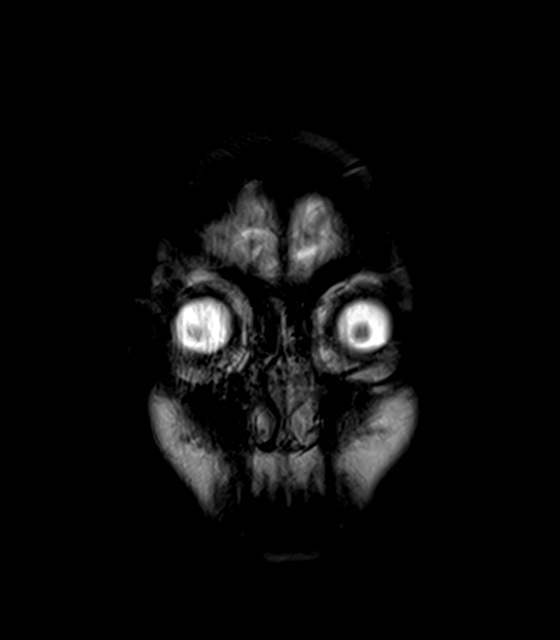

[Series 16: T1 · axial · 1.0mm · 0.94mm/px · z∈[-21,+137]mm · 10 of 160 slices shown (3 of 3)]
[im 1/160]
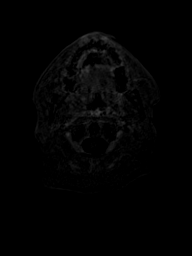
[im 18/160]
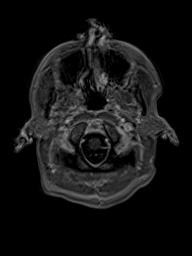
[im 36/160]
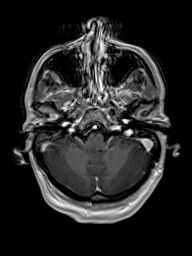
[im 54/160]
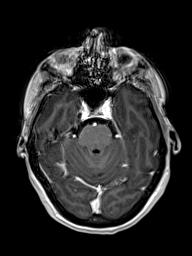
[im 71/160]
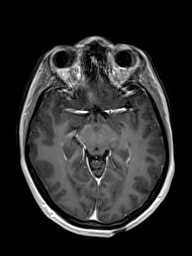
[im 89/160]
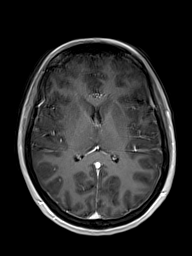
[im 107/160]
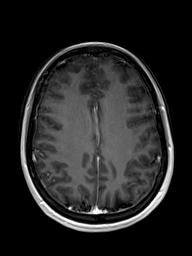
[im 124/160]
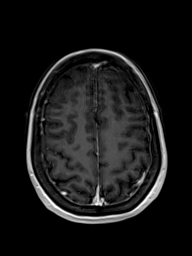
[im 142/160]
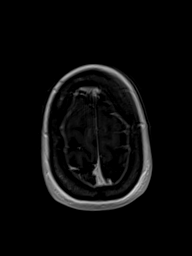
[im 160/160]
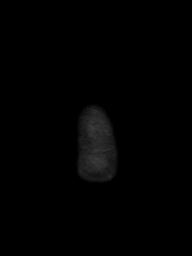

[Series 17: T1 post-contrast · coronal · 4.0mm · 0.72mm/px · 2 of 32 slices shown]
[im 1/32]
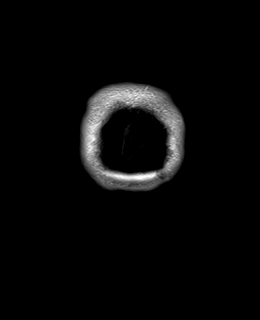
[im 32/32]
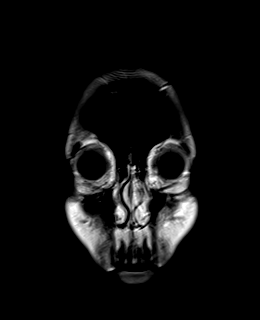

[48 of 48 positions shown; findings below may reference images not displayed]

FINDINGS: Image quality is degraded by motion artifact.

MRI HEAD FINDINGS

Brain: No focal parenchymal signal abnormality. No acute infarct or
intracranial hemorrhage. No midline shift, ventriculomegaly or
extra-axial fluid collection. No mass lesion. No abnormal
enhancement.

Vascular: Normal flow voids. Small medial right occipital and right
vertex developmental venous anomalies.

Skull : Normal marrow signal.

Sinuses/Orbits: Normal orbits. Clear paranasal sinuses. No mastoid
effusion.

Other: None.

MRI CERVICAL SPINE FINDINGS

Alignment: Normal.

Vertebrae: Normal bone marrow signal intensity. No focal osseous
lesion. No abnormal enhancement.

Cord: Normal signal and morphology.

Posterior Fossa, vertebral arteries: Please see MRI head.

Disc levels: Multilevel desiccation however disc spaces are
preserved.

C2-3: No significant disc bulge, spinal canal or neural foraminal
narrowing.

C3-4: No significant disc bulge, spinal canal or neural foraminal
narrowing.

C4-5: No significant disc bulge, spinal canal or neural foraminal
narrowing. Minimal left uncovertebral and facet degenerative
spurring.

C5-6: No significant disc bulge, spinal canal or neural foraminal
narrowing. Annular fissuring along the right lateral aspect of the
disc ([DATE].

C6-7: Small disc osteophyte complex with uncovertebral degenerative
spurring. There is annular fissuring along the left
subarticular/foraminal and extraforaminal aspects of the disc
([DATE]). No focal protrusion. Mild left neural foraminal narrowing.
Patent spinal canal and right neural foramen.

C7-T1: No significant disc bulge, spinal canal or neural foraminal
narrowing.

Paraspinal tissues: Negative.
IMPRESSION: MRI head:

Small developmental venous anomalies involving the right vertex and
medial right occipital lobe.

Otherwise unremarkable MRI brain.

MRI cervical spine:

Annular fissuring along the right lateral C5-6 disc space. Annular
fissuring involving left subarticular to extraforaminal aspects of
the C6-7 disc.

No definite focal protrusion or significant spinal canal narrowing.
Mild left C6-7 neural foraminal narrowing.

Motion degraded examination.

## 2020-03-25 MED ORDER — GADOBENATE DIMEGLUMINE 529 MG/ML IV SOLN
10.0000 mL | Freq: Once | INTRAVENOUS | Status: AC | PRN
  Administered 2020-03-25: 10 mL via INTRAVENOUS

## 2020-04-01 DIAGNOSIS — M62838 Other muscle spasm: Secondary | ICD-10-CM | POA: Diagnosis not present

## 2020-04-01 DIAGNOSIS — M4722 Other spondylosis with radiculopathy, cervical region: Secondary | ICD-10-CM | POA: Diagnosis not present

## 2020-04-01 DIAGNOSIS — M47892 Other spondylosis, cervical region: Secondary | ICD-10-CM | POA: Diagnosis not present

## 2020-04-01 DIAGNOSIS — M542 Cervicalgia: Secondary | ICD-10-CM | POA: Diagnosis not present

## 2020-04-07 DIAGNOSIS — M546 Pain in thoracic spine: Secondary | ICD-10-CM | POA: Diagnosis not present

## 2020-04-07 DIAGNOSIS — M542 Cervicalgia: Secondary | ICD-10-CM | POA: Diagnosis not present

## 2020-04-13 DIAGNOSIS — G249 Dystonia, unspecified: Secondary | ICD-10-CM | POA: Diagnosis not present

## 2020-04-13 DIAGNOSIS — M542 Cervicalgia: Secondary | ICD-10-CM | POA: Diagnosis not present

## 2020-04-13 DIAGNOSIS — M5412 Radiculopathy, cervical region: Secondary | ICD-10-CM | POA: Diagnosis not present

## 2020-04-14 DIAGNOSIS — M542 Cervicalgia: Secondary | ICD-10-CM | POA: Diagnosis not present

## 2020-04-14 DIAGNOSIS — M546 Pain in thoracic spine: Secondary | ICD-10-CM | POA: Diagnosis not present

## 2020-04-19 DIAGNOSIS — M5412 Radiculopathy, cervical region: Secondary | ICD-10-CM | POA: Diagnosis not present

## 2020-04-27 ENCOUNTER — Encounter
Payer: BC Managed Care – PPO | Attending: Physical Medicine & Rehabilitation | Admitting: Physical Medicine & Rehabilitation

## 2020-04-27 ENCOUNTER — Other Ambulatory Visit: Payer: Self-pay

## 2020-04-27 ENCOUNTER — Encounter: Payer: Self-pay | Admitting: Physical Medicine & Rehabilitation

## 2020-04-27 VITALS — BP 103/70 | HR 81 | Temp 98.4°F | Ht 62.0 in | Wt 115.6 lb

## 2020-04-27 DIAGNOSIS — G243 Spasmodic torticollis: Secondary | ICD-10-CM | POA: Diagnosis not present

## 2020-04-27 DIAGNOSIS — M47812 Spondylosis without myelopathy or radiculopathy, cervical region: Secondary | ICD-10-CM | POA: Insufficient documentation

## 2020-04-27 NOTE — Patient Instructions (Signed)
PLEASE FEEL FREE TO CALL OUR OFFICE WITH ANY PROBLEMS OR QUESTIONS ([phone])      

## 2020-04-27 NOTE — Progress Notes (Signed)
Subjective:    Patient ID: Shelby Hahn, female    DOB: 1986-01-17, 34 y.o.   MRN: [id number]  HPI  This is a f/u visit for Mrs. Shelby Hahn. At her initial visit we performed dysport injections for her cervical dystonia.  She says that she had minimal to no effects with the injections.  Her primary physician referred her for MRI of the brain and cervical spine.  I reviewed these images personally.  MRI of the brain essentially was within normal limits.  Cervical MRI revealed C6-C7 uncovertebral spurring as well as annular disc fissures with mild left foraminal stenosis.  There were more mild degenerative findings noted at C5-C6.  She was sent to see Dr. Jake Seats:.  Ultimately she went for a epidural injection.  I assume this is a C7-T1 and was a translaminar injection.  She had this about 10 days ago or so.  Her pain improved quite a bit after the injection.  She was sent for physical therapy which is helping as well.  They are working on modalities as well as posture range of motion and strengthening.  Additionally for spasm she is getting Valium 10 mg twice a day.  She was able to drive here today with minimal problem although still seems a bit difficult for her to rotate her head, today more so to the right   Pain Inventory Average Pain 4 Pain Right Now 4 My pain is intermittent, tingling and aching  In the last 24 hours, has pain interfered with the following? General activity 4 Relation with others 4 Enjoyment of life 4 What TIME of day is your pain at its worst? daytime Sleep (in general) Good  Pain is worse with: walking and some activites Pain improves with: rest, heat/ice, therapy/exercise and medication Relief from Meds: 5  History reviewed. No pertinent family history. Social History   Socioeconomic History  . Marital status: Married    Spouse name: Not on file  . Number of children: Not on file  . Years of education: Not on file  . Highest education level: Not on file    Occupational History  . Not on file  Tobacco Use  . Smoking status: Never Smoker  . Smokeless tobacco: Never Used  Vaping Use  . Vaping Use: Never used  Substance and Sexual Activity  . Alcohol use: No  . Drug use: No  . Sexual activity: Not Currently  Other Topics Concern  . Not on file  Social History Narrative  . Not on file   Social Determinants of Health   Financial Resource Strain:   . Difficulty of Paying Living Expenses: Not on file  Food Insecurity:   . Worried About Programme researcher, broadcasting/film/video in the Last Year: Not on file  . Ran Out of Food in the Last Year: Not on file  Transportation Needs:   . Lack of Transportation (Medical): Not on file  . Lack of Transportation (Non-Medical): Not on file  Physical Activity:   . Days of Exercise per Week: Not on file  . Minutes of Exercise per Session: Not on file  Stress:   . Feeling of Stress : Not on file  Social Connections:   . Frequency of Communication with Friends and Family: Not on file  . Frequency of Social Gatherings with Friends and Family: Not on file  . Attends Religious Services: Not on file  . Active Member of Clubs or Organizations: Not on file  . Attends Club or  Organization Meetings: Not on file  . Marital Status: Not on file   Past Surgical History:  Procedure Laterality Date  . CESAREAN SECTION  06/2009  . CESAREAN SECTION  06/18/2011   Procedure: CESAREAN SECTION;  Surgeon: Caprice Beaver;  Location: [facility];  Service: Gynecology;  Laterality: N/A;  . CESAREAN SECTION N/A 03/08/2017   Procedure: CESAREAN SECTION;  Surgeon: Essie Hart, MD;  Location: [facility]IRTHING SUIT[facility];  Service: Obstetrics;  Laterality: N/A;  . EYE SURGERY     bilateral eye surgery at age 62 yrs  . tummy tuck  06/2019  . WISDOM TOOTH EXTRACTION     Past Surgical History:  Procedure Laterality Date  . CESAREAN SECTION  06/2009  . CESAREAN SECTION  06/18/2011   Procedure: CESAREAN SECTION;  Surgeon: Caprice Beaver;  Location: [facility];   Service: Gynecology;  Laterality: N/A;  . CESAREAN SECTION N/A 03/08/2017   Procedure: CESAREAN SECTION;  Surgeon: Essie Hart, MD;  Location: [facility]IRTHING SUITES;  Service: Obstetrics;  Laterality: N/A;  . EYE SURGERY     bilateral eye surgery at age 56 yrs  . tummy tuck  06/2019  . WISDOM TOOTH EXTRACTION     Past Medical History:  Diagnosis Date  . Bursitis of left hip    History   BP 103/70   Pulse 81   Temp 98.4 F (36.9 C)   Ht 5\' 2"  (1.575 m)   Wt 115 lb 9.6 oz (52.4 kg)   SpO2 98%   BMI 21.14 kg/m   Opioid Risk Score:   Fall Risk Score:  `1  Depression screen PHQ 2/9  Depression screen PHQ 2/9 02/24/2020  Decreased Interest 0  Down, Depressed, Hopeless 1  PHQ - 2 Score 1  Altered sleeping 0  Tired, decreased energy 0  Change in appetite 0  Feeling bad or failure about yourself  0  Trouble concentrating 0  Moving slowly or fidgety/restless 0  Suicidal thoughts 0  PHQ-9 Score 1   Review of Systems  Constitutional: Negative.   HENT: Negative.   Cardiovascular: Negative.   Gastrointestinal: Negative.   Endocrine: Negative.   Musculoskeletal: Positive for neck pain.  Skin: Negative.   Allergic/Immunologic: Negative.   Neurological: Positive for weakness and numbness.       Tingling left arm to fingers   Hematological: Negative.   Psychiatric/Behavioral: Negative.   All other systems reviewed and are negative.      Objective:   Physical Exam  Head and shoulders are relatively neutral. Right SCM still slightly tight as is left trap>right trap.  She did not experience pain with palpation of either trap or sternocleidomastoid muscles or any of her cervical paraspinals.  No rhomboid discomfort.  Scapulae are neutral in position.   Otherwise she is a well-developed young female.  She has no obvious cranial nerve abnormalities. Strength testing was 5 out of 5 in bilateral upper extremities.  Reflexes are 2+ and there are no gross sensory findings seen.   Cognitively she is alert and appropriate.    Patient's affect was pleasant and appropriate     Assessment & Plan:     1. Cervical dystonia?  Patient certainly had spasm on EMG guided injections when we saw her at last visit.  However, since we saw no benefit whatsoever with Botox I do not favor this as the primary problem. 2. Cervical spondylosis: Findings are generally mild but she had nice results with the epidural steroid injection and with therapy so  far.  She complains of left arm tingling.  However, strength sensation and reflexes are all within normal limits in both upper extremities.      Plan: 1. Agree with PT to address cervical and shoulder girdle posture, ROM. A trial of cervical traction might be helpful to help lengthen her spine and decompress her discs as well as structure musculature.  Needs to be aggressive with her home exercise program 2. S/p cervical translaminar steroid injection---C7-T1 I suppose. Would not be anxious to pursue a translaminar injection based on her presentation and response to therapy thus far 3.  Valium is fine for now for spasm 4.  Can consider further botulinum toxin injections depending upon her course  Spent 15 minutes with the patient today in consultation and assessment/examination.  I will schedule follow-up with her in about 2 months time

## 2020-04-28 DIAGNOSIS — M546 Pain in thoracic spine: Secondary | ICD-10-CM | POA: Diagnosis not present

## 2020-04-28 DIAGNOSIS — M542 Cervicalgia: Secondary | ICD-10-CM | POA: Diagnosis not present

## 2020-05-02 DIAGNOSIS — M546 Pain in thoracic spine: Secondary | ICD-10-CM | POA: Diagnosis not present

## 2020-05-02 DIAGNOSIS — M542 Cervicalgia: Secondary | ICD-10-CM | POA: Diagnosis not present

## 2020-05-09 DIAGNOSIS — M436 Torticollis: Secondary | ICD-10-CM | POA: Diagnosis not present

## 2020-05-09 DIAGNOSIS — M542 Cervicalgia: Secondary | ICD-10-CM | POA: Diagnosis not present

## 2020-05-10 DIAGNOSIS — M5412 Radiculopathy, cervical region: Secondary | ICD-10-CM | POA: Diagnosis not present

## 2020-05-10 DIAGNOSIS — G249 Dystonia, unspecified: Secondary | ICD-10-CM | POA: Diagnosis not present

## 2020-05-12 DIAGNOSIS — M542 Cervicalgia: Secondary | ICD-10-CM | POA: Diagnosis not present

## 2020-05-12 DIAGNOSIS — M546 Pain in thoracic spine: Secondary | ICD-10-CM | POA: Diagnosis not present

## 2020-05-16 DIAGNOSIS — M542 Cervicalgia: Secondary | ICD-10-CM | POA: Diagnosis not present

## 2020-05-16 DIAGNOSIS — M546 Pain in thoracic spine: Secondary | ICD-10-CM | POA: Diagnosis not present

## 2020-05-23 DIAGNOSIS — M5412 Radiculopathy, cervical region: Secondary | ICD-10-CM | POA: Diagnosis not present

## 2020-05-24 DIAGNOSIS — M542 Cervicalgia: Secondary | ICD-10-CM | POA: Diagnosis not present

## 2020-05-24 DIAGNOSIS — M546 Pain in thoracic spine: Secondary | ICD-10-CM | POA: Diagnosis not present

## 2020-05-30 DIAGNOSIS — M546 Pain in thoracic spine: Secondary | ICD-10-CM | POA: Diagnosis not present

## 2020-05-30 DIAGNOSIS — M542 Cervicalgia: Secondary | ICD-10-CM | POA: Diagnosis not present

## 2020-06-06 DIAGNOSIS — M546 Pain in thoracic spine: Secondary | ICD-10-CM | POA: Diagnosis not present

## 2020-06-06 DIAGNOSIS — M542 Cervicalgia: Secondary | ICD-10-CM | POA: Diagnosis not present

## 2020-06-13 DIAGNOSIS — M5412 Radiculopathy, cervical region: Secondary | ICD-10-CM | POA: Diagnosis not present

## 2020-06-13 DIAGNOSIS — M546 Pain in thoracic spine: Secondary | ICD-10-CM | POA: Diagnosis not present

## 2020-06-13 DIAGNOSIS — M542 Cervicalgia: Secondary | ICD-10-CM | POA: Diagnosis not present

## 2020-06-13 DIAGNOSIS — G249 Dystonia, unspecified: Secondary | ICD-10-CM | POA: Diagnosis not present

## 2020-06-15 ENCOUNTER — Other Ambulatory Visit: Payer: Self-pay

## 2020-06-15 ENCOUNTER — Encounter
Payer: BC Managed Care – PPO | Attending: Physical Medicine & Rehabilitation | Admitting: Physical Medicine & Rehabilitation

## 2020-06-15 ENCOUNTER — Encounter: Payer: Self-pay | Admitting: Physical Medicine & Rehabilitation

## 2020-06-15 VITALS — BP 120/66 | HR 85 | Temp 99.1°F | Ht 62.0 in | Wt 115.0 lb

## 2020-06-15 DIAGNOSIS — G243 Spasmodic torticollis: Secondary | ICD-10-CM | POA: Diagnosis not present

## 2020-06-15 DIAGNOSIS — M47812 Spondylosis without myelopathy or radiculopathy, cervical region: Secondary | ICD-10-CM | POA: Diagnosis not present

## 2020-06-15 NOTE — Progress Notes (Signed)
Dysport Injection for spasticity using needle EMG guidance Indication:  Cervical dystonia  Cervical spondylosis without myelopathy   Dilution: 500 Units/73ml        Total Units Injected:  450 Indication: Severe spasticity which interferes with ADL,mobility and/or  hygiene and is unresponsive to medication management and other conservative care Informed consent was obtained after describing risks and benefits of the procedure with the patient. This includes bleeding, bruising, infection, excessive weakness, or medication side effects. A REMS form is on file and signed.  Neck/shoulder girdle Needle: 4mm injectable monopolar needle electrode  Number of units per muscle Right SCM 250 units in 3 separate access points Left upper trap 150 u in 3 separate acces points Left rhomboids 50 u in 2 separate access points All injections were done after obtaining appropriate EMG activity and after negative drawback for blood. The patient tolerated the procedure well. Post procedure instructions were given.

## 2020-06-15 NOTE — Patient Instructions (Signed)
PLEASE FEEL FREE TO CALL OUR OFFICE WITH ANY PROBLEMS OR QUESTIONS ([phone])      

## 2020-06-22 ENCOUNTER — Encounter: Payer: BC Managed Care – PPO | Admitting: Physical Medicine & Rehabilitation

## 2020-06-23 DIAGNOSIS — G243 Spasmodic torticollis: Secondary | ICD-10-CM | POA: Diagnosis not present

## 2020-06-23 DIAGNOSIS — R030 Elevated blood-pressure reading, without diagnosis of hypertension: Secondary | ICD-10-CM | POA: Diagnosis not present

## 2020-06-30 DIAGNOSIS — M546 Pain in thoracic spine: Secondary | ICD-10-CM | POA: Diagnosis not present

## 2020-06-30 DIAGNOSIS — M542 Cervicalgia: Secondary | ICD-10-CM | POA: Diagnosis not present

## 2020-07-07 DIAGNOSIS — M546 Pain in thoracic spine: Secondary | ICD-10-CM | POA: Diagnosis not present

## 2020-07-07 DIAGNOSIS — M542 Cervicalgia: Secondary | ICD-10-CM | POA: Diagnosis not present

## 2020-07-11 DIAGNOSIS — M79602 Pain in left arm: Secondary | ICD-10-CM | POA: Diagnosis not present

## 2020-07-11 DIAGNOSIS — M79641 Pain in right hand: Secondary | ICD-10-CM | POA: Diagnosis not present

## 2020-07-11 DIAGNOSIS — M5030 Other cervical disc degeneration, unspecified cervical region: Secondary | ICD-10-CM | POA: Diagnosis not present

## 2020-07-11 DIAGNOSIS — M5412 Radiculopathy, cervical region: Secondary | ICD-10-CM | POA: Diagnosis not present

## 2020-07-11 DIAGNOSIS — M79601 Pain in right arm: Secondary | ICD-10-CM | POA: Diagnosis not present

## 2020-07-11 DIAGNOSIS — M47812 Spondylosis without myelopathy or radiculopathy, cervical region: Secondary | ICD-10-CM | POA: Diagnosis not present

## 2020-07-13 DIAGNOSIS — M542 Cervicalgia: Secondary | ICD-10-CM | POA: Diagnosis not present

## 2020-07-13 DIAGNOSIS — M546 Pain in thoracic spine: Secondary | ICD-10-CM | POA: Diagnosis not present

## 2020-07-18 DIAGNOSIS — M542 Cervicalgia: Secondary | ICD-10-CM | POA: Diagnosis not present

## 2020-07-18 DIAGNOSIS — M546 Pain in thoracic spine: Secondary | ICD-10-CM | POA: Diagnosis not present

## 2020-07-26 DIAGNOSIS — M546 Pain in thoracic spine: Secondary | ICD-10-CM | POA: Diagnosis not present

## 2020-07-26 DIAGNOSIS — M542 Cervicalgia: Secondary | ICD-10-CM | POA: Diagnosis not present

## 2020-07-28 NOTE — Progress Notes (Signed)
Assessment/Plan:   1.  Dystonia  -somewhat unusual for a CD to start acutely and upon awakening one morning.  If it starts acutely, it is usually with trauma (like MVA)  -she is receiving dysport via Dr. Riley Kill.  She asked me to communicate with him and I will do that.  We did discuss that there is some data to suggest that PT in combination with botox may be better than botox alone and she is doing that now.  Discussed that counseling for stress management can be of value (she stated that stress can worsen sx's).  I unfortunately don't have any more to add to her case.  She asked about meds but I don't think that those will help her.  She has had some benefit with valium but understands its addictive nature and she has weaned down on it.  We discussed DBS but its not indicated in her.   Subjective:   Shelby Hahn was seen today in neurologic consultation at the request of Harlen Labs, NP.  The consultation is for the evaluation of cervical dystonia.  Records made available to me have been reviewed.  Patient has seen movement disorder at [facility] for the same problem.  She has also been to see Dr. Riley Kill.  Patient was first seen by neurology at [facility] on July 8.  Notes indicate that patient woke up 2 weeks prior to that with head turning to the left.  She was referred by the neurologist to Dr. Evern Bio (movement specialist), who saw the patient on July 26 for dystonia.  Those notes indicate that patient had actually noted movements for several months prior to diagnosis (pt denies this today), but it was worse after she woke up that one morning.  Treatment with Botox was recommended to the patient.  She did not receive Botox at [facility].  She transferred her care to Dr. Riley Kill and saw him first on February 24, 2020.  Patient was started on Dysport injections that day.  She did not think that this first injections were beneficial per his notes.  She followed back up with Dr.  Riley Kill in October and reported that she had an epidural injection and physical therapy to the neck which were somewhat helpful (later had subsequent injections which did not seem that helpful).  Dr. Riley Kill followed the patient back up in November and did repeat Dysport injections.  Pt states that she doesn't think that they have been helpful.  Initially, she did but now thinks that it was the fact that she was taking valium daily.  Now she is taking it q 4 days.    She sought PMR consultation at [facility] on December 20, primarily for EMG.  EMG did not demonstrate any evidence of radiculopathy or peripheral neuropathy in the left upper extremity.  MRI of the brain was completed on March 25, 2020 and I personally reviewed this.  This was done with and without gadolinium and was essentially unremarkable.  There was motion artifact.  MRI cervical spine also done with and without gadolinium on that same date.  There was no cervical spinal stenosis or significant neural foraminal stenosis.  She states that her head wants to turn to the left.  She cannot control that movement.  Stress increases the sx.  PREVIOUS MEDICATIONS: Baclofen, tizanidine, Valium; clonazepam  ALLERGIES:  No Known Allergies  CURRENT MEDICATIONS:  Outpatient Encounter Medications as of 08/02/2020  Medication Sig  . diazepam (VALIUM) 10 MG tablet Take  10 mg by mouth 2 (two) times daily.  Marland Kitchen ibuprofen (ADVIL,MOTRIN) 600 MG tablet Take 1 tablet (600 mg total) by mouth every 6 (six) hours.  . Multiple Vitamins-Minerals (MULTIVITAMIN WITH MINERALS) tablet Take 1 tablet by mouth daily.   No facility-administered encounter medications on file as of 08/02/2020.    Objective:   PHYSICAL EXAMINATION:    VITALS:   Vitals:   08/02/20 0930  BP: 104/64  Pulse: 83  SpO2: 99%  Weight: 118 lb (53.5 kg)  Height: 5\' 2"  (1.575 m)    GEN:  Normal appears female in no acute distress.  Appears stated age. HEENT:  Normocephalic,  atraumatic. The mucous membranes are moist. The superficial temporal arteries are without ropiness or tenderness. Cardiovascular: Regular rate and rhythm. Lungs: Clear to auscultation bilaterally. Neck/Heme: There are no carotid bruits noted bilaterally.  No head tremor.  No null point.  Initially, when walking in room, head was turned to the right but most of the remainder of visit head slowly to the L.  No hypertrophy of SCM/[place]/traps.  When head passively moved to midline, patient actively moves back to the L without associated head tremor (overcoming the passive motion from examiner).  NEUROLOGICAL: Orientation:  The patient is alert and oriented x 3.   Cranial nerves: There is good facial symmetry.   Speech is fluent and clear. Soft palate rises symmetrically and there is no tongue deviation. Hearing is intact to conversational tone. Tone: Tone is good throughout. Sensation: Sensation is intact to light touch touch Coordination:  The patient has no difficulty with RAM's or FNF bilaterally. Motor: Strength is 5/5 in the bilateral upper and lower extremities.  Shoulder shrug is equal and symmetric. There is no pronator drift.  There are no fasciculations noted. DTR's: Deep tendon reflexes are 2/4 at the bilateral biceps, triceps, brachioradialis, patella and achilles.  Plantar responses are downgoing bilaterally. Gait and Station: The patient is able to ambulate without difficulty. The patient has some astasia abasia with heel toe but is able to catch herself.    Total time spent on today's visit was 45 minutes, including both face-to-face time and nonface-to-face time.  Time included that spent on review of records (most of time spent reviewing prior notes available to me), discussing treatment and goals, answering patient's questions and coordinating care.   Cc:  , NP

## 2020-08-02 ENCOUNTER — Other Ambulatory Visit: Payer: Self-pay

## 2020-08-02 ENCOUNTER — Encounter: Payer: Self-pay | Admitting: Neurology

## 2020-08-02 ENCOUNTER — Ambulatory Visit: Payer: BC Managed Care – PPO | Admitting: Neurology

## 2020-08-02 VITALS — BP 104/64 | HR 83 | Ht 62.0 in | Wt 118.0 lb

## 2020-08-02 DIAGNOSIS — R259 Unspecified abnormal involuntary movements: Secondary | ICD-10-CM

## 2020-08-04 DIAGNOSIS — M542 Cervicalgia: Secondary | ICD-10-CM | POA: Diagnosis not present

## 2020-08-04 DIAGNOSIS — M546 Pain in thoracic spine: Secondary | ICD-10-CM | POA: Diagnosis not present

## 2020-08-11 DIAGNOSIS — M546 Pain in thoracic spine: Secondary | ICD-10-CM | POA: Diagnosis not present

## 2020-08-11 DIAGNOSIS — M542 Cervicalgia: Secondary | ICD-10-CM | POA: Diagnosis not present

## 2020-08-18 DIAGNOSIS — M546 Pain in thoracic spine: Secondary | ICD-10-CM | POA: Diagnosis not present

## 2020-08-18 DIAGNOSIS — M542 Cervicalgia: Secondary | ICD-10-CM | POA: Diagnosis not present

## 2020-08-25 DIAGNOSIS — M542 Cervicalgia: Secondary | ICD-10-CM | POA: Diagnosis not present

## 2020-08-25 DIAGNOSIS — M546 Pain in thoracic spine: Secondary | ICD-10-CM | POA: Diagnosis not present

## 2020-09-01 DIAGNOSIS — M546 Pain in thoracic spine: Secondary | ICD-10-CM | POA: Diagnosis not present

## 2020-09-01 DIAGNOSIS — M542 Cervicalgia: Secondary | ICD-10-CM | POA: Diagnosis not present

## 2020-09-08 DIAGNOSIS — M542 Cervicalgia: Secondary | ICD-10-CM | POA: Diagnosis not present

## 2020-09-08 DIAGNOSIS — M546 Pain in thoracic spine: Secondary | ICD-10-CM | POA: Diagnosis not present

## 2020-09-15 DIAGNOSIS — M546 Pain in thoracic spine: Secondary | ICD-10-CM | POA: Diagnosis not present

## 2020-09-15 DIAGNOSIS — M542 Cervicalgia: Secondary | ICD-10-CM | POA: Diagnosis not present

## 2020-09-21 ENCOUNTER — Encounter
Payer: BC Managed Care – PPO | Attending: Physical Medicine & Rehabilitation | Admitting: Physical Medicine & Rehabilitation

## 2020-09-21 ENCOUNTER — Other Ambulatory Visit: Payer: Self-pay

## 2020-09-21 ENCOUNTER — Encounter: Payer: Self-pay | Admitting: Physical Medicine & Rehabilitation

## 2020-09-21 VITALS — BP 118/70 | HR 81 | Temp 98.6°F | Ht 62.0 in | Wt 118.2 lb

## 2020-09-21 DIAGNOSIS — G243 Spasmodic torticollis: Secondary | ICD-10-CM | POA: Insufficient documentation

## 2020-09-21 NOTE — Progress Notes (Signed)
Dysport Injection for spasticity using needle EMG guidance Indication:  Cervical dystonia   Dilution: 500 Units/37ml        Total Units Injected:  500 Indication: Severe spasticity which interferes with ADL,mobility and/or  hygiene and is unresponsive to medication management and other conservative care Informed consent was obtained after describing risks and benefits of the procedure with the patient. This includes bleeding, bruising, infection, excessive weakness, or medication side effects. A REMS form is on file and signed.   Needle: 78mm injectable monopolar needle electrode  Number of units per muscle Right SCM 25  units Right trap 25 units Left SCM 250 units Left trap 125 units Left levator scapulae 75 units All injections were done after obtaining appropriate EMG activity and after negative drawback for blood. The patient tolerated the procedure well. Post procedure instructions were given.

## 2020-09-21 NOTE — Patient Instructions (Signed)
PLEASE FEEL FREE TO CALL OUR OFFICE WITH ANY PROBLEMS OR QUESTIONS ([phone])      

## 2020-09-22 DIAGNOSIS — M546 Pain in thoracic spine: Secondary | ICD-10-CM | POA: Diagnosis not present

## 2020-09-22 DIAGNOSIS — M542 Cervicalgia: Secondary | ICD-10-CM | POA: Diagnosis not present

## 2020-09-29 DIAGNOSIS — M546 Pain in thoracic spine: Secondary | ICD-10-CM | POA: Diagnosis not present

## 2020-09-29 DIAGNOSIS — M542 Cervicalgia: Secondary | ICD-10-CM | POA: Diagnosis not present

## 2020-10-05 DIAGNOSIS — M546 Pain in thoracic spine: Secondary | ICD-10-CM | POA: Diagnosis not present

## 2020-10-05 DIAGNOSIS — M542 Cervicalgia: Secondary | ICD-10-CM | POA: Diagnosis not present

## 2020-10-18 DIAGNOSIS — G259 Extrapyramidal and movement disorder, unspecified: Secondary | ICD-10-CM | POA: Diagnosis not present

## 2020-10-18 DIAGNOSIS — Z6821 Body mass index (BMI) 21.0-21.9, adult: Secondary | ICD-10-CM | POA: Diagnosis not present

## 2020-10-19 DIAGNOSIS — M542 Cervicalgia: Secondary | ICD-10-CM | POA: Diagnosis not present

## 2020-10-19 DIAGNOSIS — M546 Pain in thoracic spine: Secondary | ICD-10-CM | POA: Diagnosis not present

## 2020-10-27 DIAGNOSIS — M542 Cervicalgia: Secondary | ICD-10-CM | POA: Diagnosis not present

## 2020-10-27 DIAGNOSIS — M546 Pain in thoracic spine: Secondary | ICD-10-CM | POA: Diagnosis not present

## 2020-11-07 DIAGNOSIS — M546 Pain in thoracic spine: Secondary | ICD-10-CM | POA: Diagnosis not present

## 2020-11-07 DIAGNOSIS — M5413 Radiculopathy, cervicothoracic region: Secondary | ICD-10-CM | POA: Diagnosis not present

## 2020-11-07 DIAGNOSIS — M62830 Muscle spasm of back: Secondary | ICD-10-CM | POA: Diagnosis not present

## 2020-11-07 DIAGNOSIS — M5136 Other intervertebral disc degeneration, lumbar region: Secondary | ICD-10-CM | POA: Diagnosis not present

## 2020-11-09 DIAGNOSIS — M546 Pain in thoracic spine: Secondary | ICD-10-CM | POA: Diagnosis not present

## 2020-11-09 DIAGNOSIS — M5136 Other intervertebral disc degeneration, lumbar region: Secondary | ICD-10-CM | POA: Diagnosis not present

## 2020-11-09 DIAGNOSIS — M5442 Lumbago with sciatica, left side: Secondary | ICD-10-CM | POA: Diagnosis not present

## 2020-11-09 DIAGNOSIS — M5413 Radiculopathy, cervicothoracic region: Secondary | ICD-10-CM | POA: Diagnosis not present

## 2020-11-10 DIAGNOSIS — M542 Cervicalgia: Secondary | ICD-10-CM | POA: Diagnosis not present

## 2020-11-10 DIAGNOSIS — G243 Spasmodic torticollis: Secondary | ICD-10-CM | POA: Diagnosis not present

## 2020-11-10 DIAGNOSIS — M546 Pain in thoracic spine: Secondary | ICD-10-CM | POA: Diagnosis not present

## 2020-11-14 DIAGNOSIS — M546 Pain in thoracic spine: Secondary | ICD-10-CM | POA: Diagnosis not present

## 2020-11-14 DIAGNOSIS — M5413 Radiculopathy, cervicothoracic region: Secondary | ICD-10-CM | POA: Diagnosis not present

## 2020-11-14 DIAGNOSIS — M5442 Lumbago with sciatica, left side: Secondary | ICD-10-CM | POA: Diagnosis not present

## 2020-11-14 DIAGNOSIS — M5136 Other intervertebral disc degeneration, lumbar region: Secondary | ICD-10-CM | POA: Diagnosis not present

## 2020-11-17 DIAGNOSIS — M5442 Lumbago with sciatica, left side: Secondary | ICD-10-CM | POA: Diagnosis not present

## 2020-11-17 DIAGNOSIS — M546 Pain in thoracic spine: Secondary | ICD-10-CM | POA: Diagnosis not present

## 2020-11-17 DIAGNOSIS — M5413 Radiculopathy, cervicothoracic region: Secondary | ICD-10-CM | POA: Diagnosis not present

## 2020-11-17 DIAGNOSIS — M5136 Other intervertebral disc degeneration, lumbar region: Secondary | ICD-10-CM | POA: Diagnosis not present

## 2020-11-18 DIAGNOSIS — M5136 Other intervertebral disc degeneration, lumbar region: Secondary | ICD-10-CM | POA: Diagnosis not present

## 2020-11-18 DIAGNOSIS — M5413 Radiculopathy, cervicothoracic region: Secondary | ICD-10-CM | POA: Diagnosis not present

## 2020-11-18 DIAGNOSIS — M5442 Lumbago with sciatica, left side: Secondary | ICD-10-CM | POA: Diagnosis not present

## 2020-11-18 DIAGNOSIS — M546 Pain in thoracic spine: Secondary | ICD-10-CM | POA: Diagnosis not present

## 2020-11-21 DIAGNOSIS — M5442 Lumbago with sciatica, left side: Secondary | ICD-10-CM | POA: Diagnosis not present

## 2020-11-21 DIAGNOSIS — M546 Pain in thoracic spine: Secondary | ICD-10-CM | POA: Diagnosis not present

## 2020-11-21 DIAGNOSIS — M5413 Radiculopathy, cervicothoracic region: Secondary | ICD-10-CM | POA: Diagnosis not present

## 2020-11-21 DIAGNOSIS — M5136 Other intervertebral disc degeneration, lumbar region: Secondary | ICD-10-CM | POA: Diagnosis not present

## 2020-11-23 DIAGNOSIS — M546 Pain in thoracic spine: Secondary | ICD-10-CM | POA: Diagnosis not present

## 2020-11-23 DIAGNOSIS — M542 Cervicalgia: Secondary | ICD-10-CM | POA: Diagnosis not present

## 2020-11-24 DIAGNOSIS — M5413 Radiculopathy, cervicothoracic region: Secondary | ICD-10-CM | POA: Diagnosis not present

## 2020-11-24 DIAGNOSIS — M5136 Other intervertebral disc degeneration, lumbar region: Secondary | ICD-10-CM | POA: Diagnosis not present

## 2020-11-24 DIAGNOSIS — M546 Pain in thoracic spine: Secondary | ICD-10-CM | POA: Diagnosis not present

## 2020-11-24 DIAGNOSIS — M5442 Lumbago with sciatica, left side: Secondary | ICD-10-CM | POA: Diagnosis not present

## 2020-11-25 DIAGNOSIS — M5442 Lumbago with sciatica, left side: Secondary | ICD-10-CM | POA: Diagnosis not present

## 2020-11-25 DIAGNOSIS — M546 Pain in thoracic spine: Secondary | ICD-10-CM | POA: Diagnosis not present

## 2020-11-25 DIAGNOSIS — M5413 Radiculopathy, cervicothoracic region: Secondary | ICD-10-CM | POA: Diagnosis not present

## 2020-11-25 DIAGNOSIS — M5136 Other intervertebral disc degeneration, lumbar region: Secondary | ICD-10-CM | POA: Diagnosis not present

## 2020-11-28 DIAGNOSIS — M546 Pain in thoracic spine: Secondary | ICD-10-CM | POA: Diagnosis not present

## 2020-11-28 DIAGNOSIS — M5136 Other intervertebral disc degeneration, lumbar region: Secondary | ICD-10-CM | POA: Diagnosis not present

## 2020-11-28 DIAGNOSIS — M5413 Radiculopathy, cervicothoracic region: Secondary | ICD-10-CM | POA: Diagnosis not present

## 2020-11-28 DIAGNOSIS — M542 Cervicalgia: Secondary | ICD-10-CM | POA: Diagnosis not present

## 2020-11-28 DIAGNOSIS — M5442 Lumbago with sciatica, left side: Secondary | ICD-10-CM | POA: Diagnosis not present

## 2020-12-01 DIAGNOSIS — M5136 Other intervertebral disc degeneration, lumbar region: Secondary | ICD-10-CM | POA: Diagnosis not present

## 2020-12-01 DIAGNOSIS — M542 Cervicalgia: Secondary | ICD-10-CM | POA: Diagnosis not present

## 2020-12-01 DIAGNOSIS — M5413 Radiculopathy, cervicothoracic region: Secondary | ICD-10-CM | POA: Diagnosis not present

## 2020-12-01 DIAGNOSIS — M546 Pain in thoracic spine: Secondary | ICD-10-CM | POA: Diagnosis not present

## 2020-12-01 DIAGNOSIS — M5442 Lumbago with sciatica, left side: Secondary | ICD-10-CM | POA: Diagnosis not present

## 2020-12-02 DIAGNOSIS — M5413 Radiculopathy, cervicothoracic region: Secondary | ICD-10-CM | POA: Diagnosis not present

## 2020-12-02 DIAGNOSIS — M5136 Other intervertebral disc degeneration, lumbar region: Secondary | ICD-10-CM | POA: Diagnosis not present

## 2020-12-02 DIAGNOSIS — M546 Pain in thoracic spine: Secondary | ICD-10-CM | POA: Diagnosis not present

## 2020-12-02 DIAGNOSIS — M5442 Lumbago with sciatica, left side: Secondary | ICD-10-CM | POA: Diagnosis not present

## 2020-12-05 DIAGNOSIS — M5413 Radiculopathy, cervicothoracic region: Secondary | ICD-10-CM | POA: Diagnosis not present

## 2020-12-05 DIAGNOSIS — M5442 Lumbago with sciatica, left side: Secondary | ICD-10-CM | POA: Diagnosis not present

## 2020-12-05 DIAGNOSIS — M5136 Other intervertebral disc degeneration, lumbar region: Secondary | ICD-10-CM | POA: Diagnosis not present

## 2020-12-05 DIAGNOSIS — M546 Pain in thoracic spine: Secondary | ICD-10-CM | POA: Diagnosis not present

## 2020-12-06 DIAGNOSIS — M542 Cervicalgia: Secondary | ICD-10-CM | POA: Diagnosis not present

## 2020-12-06 DIAGNOSIS — M546 Pain in thoracic spine: Secondary | ICD-10-CM | POA: Diagnosis not present

## 2020-12-08 DIAGNOSIS — M5136 Other intervertebral disc degeneration, lumbar region: Secondary | ICD-10-CM | POA: Diagnosis not present

## 2020-12-08 DIAGNOSIS — M5413 Radiculopathy, cervicothoracic region: Secondary | ICD-10-CM | POA: Diagnosis not present

## 2020-12-08 DIAGNOSIS — M546 Pain in thoracic spine: Secondary | ICD-10-CM | POA: Diagnosis not present

## 2020-12-08 DIAGNOSIS — M5442 Lumbago with sciatica, left side: Secondary | ICD-10-CM | POA: Diagnosis not present

## 2020-12-09 DIAGNOSIS — M546 Pain in thoracic spine: Secondary | ICD-10-CM | POA: Diagnosis not present

## 2020-12-09 DIAGNOSIS — M5413 Radiculopathy, cervicothoracic region: Secondary | ICD-10-CM | POA: Diagnosis not present

## 2020-12-09 DIAGNOSIS — M5442 Lumbago with sciatica, left side: Secondary | ICD-10-CM | POA: Diagnosis not present

## 2020-12-09 DIAGNOSIS — M5136 Other intervertebral disc degeneration, lumbar region: Secondary | ICD-10-CM | POA: Diagnosis not present

## 2020-12-12 DIAGNOSIS — M5413 Radiculopathy, cervicothoracic region: Secondary | ICD-10-CM | POA: Diagnosis not present

## 2020-12-12 DIAGNOSIS — M5136 Other intervertebral disc degeneration, lumbar region: Secondary | ICD-10-CM | POA: Diagnosis not present

## 2020-12-12 DIAGNOSIS — M5442 Lumbago with sciatica, left side: Secondary | ICD-10-CM | POA: Diagnosis not present

## 2020-12-12 DIAGNOSIS — M546 Pain in thoracic spine: Secondary | ICD-10-CM | POA: Diagnosis not present

## 2020-12-14 DIAGNOSIS — M5413 Radiculopathy, cervicothoracic region: Secondary | ICD-10-CM | POA: Diagnosis not present

## 2020-12-14 DIAGNOSIS — M542 Cervicalgia: Secondary | ICD-10-CM | POA: Diagnosis not present

## 2020-12-14 DIAGNOSIS — M546 Pain in thoracic spine: Secondary | ICD-10-CM | POA: Diagnosis not present

## 2020-12-14 DIAGNOSIS — M5442 Lumbago with sciatica, left side: Secondary | ICD-10-CM | POA: Diagnosis not present

## 2020-12-14 DIAGNOSIS — M5136 Other intervertebral disc degeneration, lumbar region: Secondary | ICD-10-CM | POA: Diagnosis not present

## 2020-12-15 DIAGNOSIS — M5136 Other intervertebral disc degeneration, lumbar region: Secondary | ICD-10-CM | POA: Diagnosis not present

## 2020-12-15 DIAGNOSIS — M5413 Radiculopathy, cervicothoracic region: Secondary | ICD-10-CM | POA: Diagnosis not present

## 2020-12-15 DIAGNOSIS — M546 Pain in thoracic spine: Secondary | ICD-10-CM | POA: Diagnosis not present

## 2020-12-15 DIAGNOSIS — M5442 Lumbago with sciatica, left side: Secondary | ICD-10-CM | POA: Diagnosis not present

## 2020-12-21 DIAGNOSIS — M542 Cervicalgia: Secondary | ICD-10-CM | POA: Diagnosis not present

## 2020-12-21 DIAGNOSIS — M5136 Other intervertebral disc degeneration, lumbar region: Secondary | ICD-10-CM | POA: Diagnosis not present

## 2020-12-21 DIAGNOSIS — M5442 Lumbago with sciatica, left side: Secondary | ICD-10-CM | POA: Diagnosis not present

## 2020-12-21 DIAGNOSIS — M5413 Radiculopathy, cervicothoracic region: Secondary | ICD-10-CM | POA: Diagnosis not present

## 2020-12-21 DIAGNOSIS — M546 Pain in thoracic spine: Secondary | ICD-10-CM | POA: Diagnosis not present

## 2020-12-28 ENCOUNTER — Encounter
Payer: BC Managed Care – PPO | Attending: Physical Medicine & Rehabilitation | Admitting: Physical Medicine & Rehabilitation

## 2020-12-28 ENCOUNTER — Encounter: Payer: Self-pay | Admitting: Physical Medicine & Rehabilitation

## 2020-12-28 ENCOUNTER — Other Ambulatory Visit: Payer: Self-pay

## 2020-12-28 VITALS — BP 113/74 | HR 81 | Temp 98.1°F | Ht 62.0 in | Wt 112.8 lb

## 2020-12-28 DIAGNOSIS — G243 Spasmodic torticollis: Secondary | ICD-10-CM | POA: Insufficient documentation

## 2020-12-28 NOTE — Progress Notes (Addendum)
Dysport Injection for spasticity using needle EMG guidance Indication:  No diagnosis found.   Dilution: 500 Units/30ml        Total Units Injected:  500 + 250 Indication: Severe spasticity which interferes with ADL,mobility and/or  hygiene and is unresponsive to medication management and other conservative care Informed consent was obtained after describing risks and benefits of the procedure with the patient. This includes bleeding, bruising, infection, excessive weakness, or medication side effects. A REMS form is on file and signed.  left Needle: 34mm injectable monopolar needle electrode  We utilized 500units authorized by insurance and 250units from our sample supply.   Number of units per muscle Trapezius 150 3 access points Levator Scapulae 100 2 access points Sternocleidomastoid 500 4 access points (area of greatest activity)   All injections were done after obtaining appropriate EMG activity and after negative drawback for blood. The patient tolerated the procedure well. Post procedure instructions were given.  Advised the patient that the higher doses of botulinum toxin in the cervical region can lead to difficulties with head posture, excessive weakness as well as dysphagia. Pt willing to try higher, focused doses as above given severity of her dystonia.

## 2020-12-28 NOTE — Patient Instructions (Signed)
PLEASE FEEL FREE TO CALL OUR OFFICE WITH ANY PROBLEMS OR QUESTIONS ([phone])      

## 2020-12-29 DIAGNOSIS — M546 Pain in thoracic spine: Secondary | ICD-10-CM | POA: Diagnosis not present

## 2020-12-29 DIAGNOSIS — M5136 Other intervertebral disc degeneration, lumbar region: Secondary | ICD-10-CM | POA: Diagnosis not present

## 2020-12-29 DIAGNOSIS — M5442 Lumbago with sciatica, left side: Secondary | ICD-10-CM | POA: Diagnosis not present

## 2020-12-29 DIAGNOSIS — M5413 Radiculopathy, cervicothoracic region: Secondary | ICD-10-CM | POA: Diagnosis not present

## 2021-01-04 DIAGNOSIS — M5442 Lumbago with sciatica, left side: Secondary | ICD-10-CM | POA: Diagnosis not present

## 2021-01-04 DIAGNOSIS — M546 Pain in thoracic spine: Secondary | ICD-10-CM | POA: Diagnosis not present

## 2021-01-04 DIAGNOSIS — M5136 Other intervertebral disc degeneration, lumbar region: Secondary | ICD-10-CM | POA: Diagnosis not present

## 2021-01-04 DIAGNOSIS — M5413 Radiculopathy, cervicothoracic region: Secondary | ICD-10-CM | POA: Diagnosis not present

## 2021-01-11 DIAGNOSIS — M5136 Other intervertebral disc degeneration, lumbar region: Secondary | ICD-10-CM | POA: Diagnosis not present

## 2021-01-11 DIAGNOSIS — M546 Pain in thoracic spine: Secondary | ICD-10-CM | POA: Diagnosis not present

## 2021-01-11 DIAGNOSIS — M5413 Radiculopathy, cervicothoracic region: Secondary | ICD-10-CM | POA: Diagnosis not present

## 2021-01-11 DIAGNOSIS — M5442 Lumbago with sciatica, left side: Secondary | ICD-10-CM | POA: Diagnosis not present

## 2021-01-18 DIAGNOSIS — M546 Pain in thoracic spine: Secondary | ICD-10-CM | POA: Diagnosis not present

## 2021-01-18 DIAGNOSIS — M5136 Other intervertebral disc degeneration, lumbar region: Secondary | ICD-10-CM | POA: Diagnosis not present

## 2021-01-18 DIAGNOSIS — M5442 Lumbago with sciatica, left side: Secondary | ICD-10-CM | POA: Diagnosis not present

## 2021-01-18 DIAGNOSIS — M5413 Radiculopathy, cervicothoracic region: Secondary | ICD-10-CM | POA: Diagnosis not present

## 2021-01-24 DIAGNOSIS — Z6820 Body mass index (BMI) 20.0-20.9, adult: Secondary | ICD-10-CM | POA: Diagnosis not present

## 2021-01-24 DIAGNOSIS — G243 Spasmodic torticollis: Secondary | ICD-10-CM | POA: Diagnosis not present

## 2021-02-07 DIAGNOSIS — G243 Spasmodic torticollis: Secondary | ICD-10-CM | POA: Diagnosis not present

## 2021-02-07 DIAGNOSIS — G249 Dystonia, unspecified: Secondary | ICD-10-CM | POA: Diagnosis not present

## 2021-02-13 DIAGNOSIS — M5136 Other intervertebral disc degeneration, lumbar region: Secondary | ICD-10-CM | POA: Diagnosis not present

## 2021-02-13 DIAGNOSIS — M5413 Radiculopathy, cervicothoracic region: Secondary | ICD-10-CM | POA: Diagnosis not present

## 2021-02-13 DIAGNOSIS — M5442 Lumbago with sciatica, left side: Secondary | ICD-10-CM | POA: Diagnosis not present

## 2021-02-13 DIAGNOSIS — M546 Pain in thoracic spine: Secondary | ICD-10-CM | POA: Diagnosis not present

## 2021-02-20 DIAGNOSIS — M5413 Radiculopathy, cervicothoracic region: Secondary | ICD-10-CM | POA: Diagnosis not present

## 2021-02-20 DIAGNOSIS — M546 Pain in thoracic spine: Secondary | ICD-10-CM | POA: Diagnosis not present

## 2021-02-20 DIAGNOSIS — M5136 Other intervertebral disc degeneration, lumbar region: Secondary | ICD-10-CM | POA: Diagnosis not present

## 2021-02-20 DIAGNOSIS — M5442 Lumbago with sciatica, left side: Secondary | ICD-10-CM | POA: Diagnosis not present

## 2021-02-27 DIAGNOSIS — M546 Pain in thoracic spine: Secondary | ICD-10-CM | POA: Diagnosis not present

## 2021-02-27 DIAGNOSIS — M5413 Radiculopathy, cervicothoracic region: Secondary | ICD-10-CM | POA: Diagnosis not present

## 2021-02-27 DIAGNOSIS — M5442 Lumbago with sciatica, left side: Secondary | ICD-10-CM | POA: Diagnosis not present

## 2021-02-27 DIAGNOSIS — M5136 Other intervertebral disc degeneration, lumbar region: Secondary | ICD-10-CM | POA: Diagnosis not present

## 2021-03-06 DIAGNOSIS — M5413 Radiculopathy, cervicothoracic region: Secondary | ICD-10-CM | POA: Diagnosis not present

## 2021-03-06 DIAGNOSIS — M5136 Other intervertebral disc degeneration, lumbar region: Secondary | ICD-10-CM | POA: Diagnosis not present

## 2021-03-06 DIAGNOSIS — M546 Pain in thoracic spine: Secondary | ICD-10-CM | POA: Diagnosis not present

## 2021-03-06 DIAGNOSIS — M5442 Lumbago with sciatica, left side: Secondary | ICD-10-CM | POA: Diagnosis not present

## 2021-03-13 ENCOUNTER — Other Ambulatory Visit: Payer: Self-pay | Admitting: Student

## 2021-03-13 DIAGNOSIS — M542 Cervicalgia: Secondary | ICD-10-CM

## 2021-03-14 DIAGNOSIS — G243 Spasmodic torticollis: Secondary | ICD-10-CM | POA: Diagnosis not present

## 2021-03-15 ENCOUNTER — Ambulatory Visit
Admission: RE | Admit: 2021-03-15 | Discharge: 2021-03-15 | Disposition: A | Discharge status: Home / Self Care | Payer: BC Managed Care – PPO | Source: Ambulatory Visit | Attending: Student | Admitting: Student

## 2021-03-15 ENCOUNTER — Other Ambulatory Visit: Payer: Self-pay

## 2021-03-15 DIAGNOSIS — R200 Anesthesia of skin: Secondary | ICD-10-CM | POA: Diagnosis not present

## 2021-03-15 DIAGNOSIS — M5413 Radiculopathy, cervicothoracic region: Secondary | ICD-10-CM | POA: Diagnosis not present

## 2021-03-15 DIAGNOSIS — M5442 Lumbago with sciatica, left side: Secondary | ICD-10-CM | POA: Diagnosis not present

## 2021-03-15 DIAGNOSIS — M546 Pain in thoracic spine: Secondary | ICD-10-CM | POA: Diagnosis not present

## 2021-03-15 DIAGNOSIS — G249 Dystonia, unspecified: Secondary | ICD-10-CM | POA: Diagnosis not present

## 2021-03-15 DIAGNOSIS — M542 Cervicalgia: Secondary | ICD-10-CM

## 2021-03-15 DIAGNOSIS — M5136 Other intervertebral disc degeneration, lumbar region: Secondary | ICD-10-CM | POA: Diagnosis not present

## 2021-03-15 IMAGING — MR MR HEAD WO/W CM
13 series · 48 of 48 positions shown · IV contrast (multihance)
Comparison: [DATE].

CLINICAL DATA: Cervical dystonia. Pain and numbness in the right
side of the neck.

EXAM:
MRI HEAD WITHOUT AND WITH CONTRAST
TECHNIQUE: Multiplanar, multiecho pulse sequences of the brain and surrounding
structures were obtained without and with intravenous contrast.
CONTRAST:  10mL MULTIHANCE GADOBENATE DIMEGLUMINE 529 MG/ML IV SOLN

[Series 5: T1 · sagittal · 4.0mm · 0.75mm/px · 2 of 31 slices shown (1 of 3)]
[im 1/31]
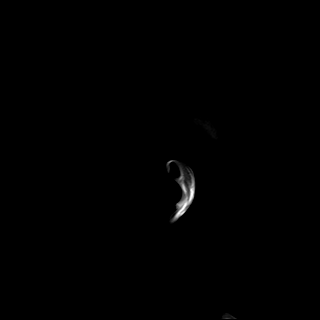
[im 31/31]
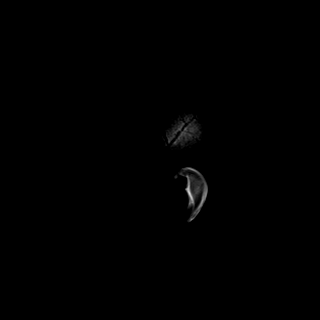

[Series 6: DWI · axial · 3.0mm · 0.94mm/px · z∈[-8,+129]mm · 8 of 160 slices shown (1 of 3)]
[im 1/160]
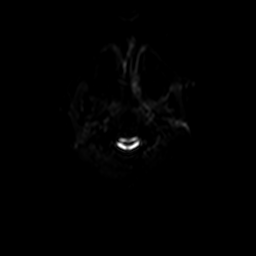
[im 23/160]
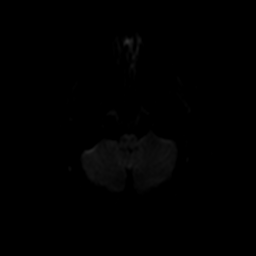
[im 46/160]
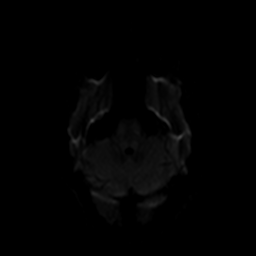
[im 69/160]
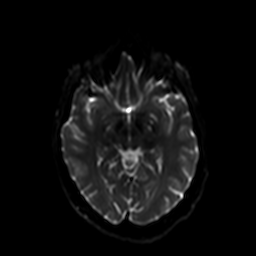
[im 91/160]
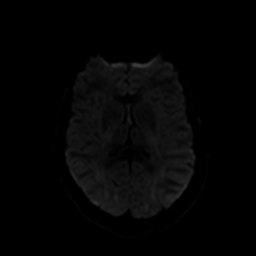
[im 114/160]
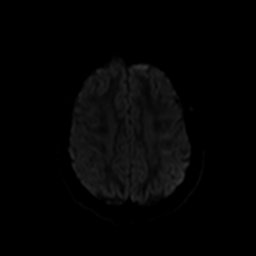
[im 137/160]
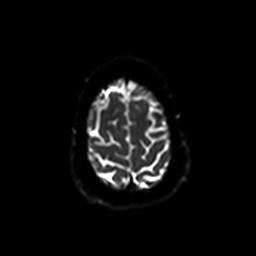
[im 160/160]
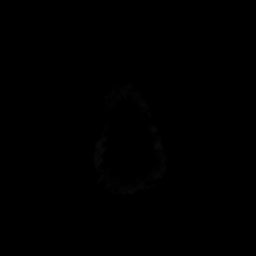

[Series 7: ax dwi_tracew · axial · 3.0mm · 0.94mm/px · z∈[-8,+129]mm · 4 of 80 slices shown]
[im 1/80]
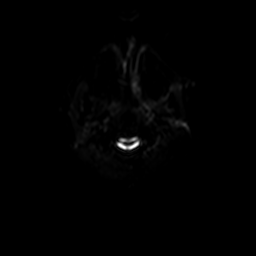
[im 27/80]
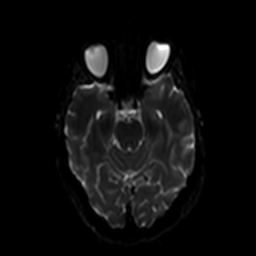
[im 53/80]
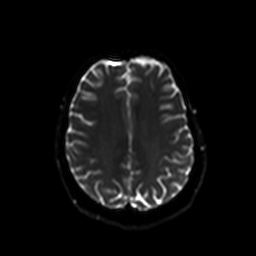
[im 80/80]
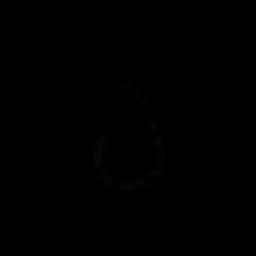

[Series 8: ax dwi_adc · axial · 3.0mm · 0.94mm/px · z∈[-8,+129]mm · 2 of 40 slices shown]
[im 1/40]
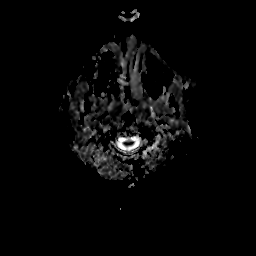
[im 40/40]
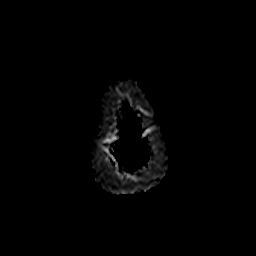

[Series 9: DWI · coronal · 5.0mm · 1.44mm/px · 3 of 60 slices shown (2 of 3)]
[im 1/60]
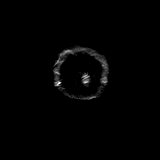
[im 30/60]
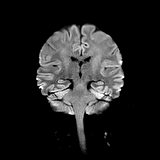
[im 60/60]
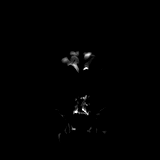

[Series 10: DWI · coronal · 5.0mm · 1.44mm/px · 2 of 30 slices shown (3 of 3)]
[im 1/30]
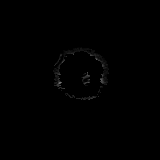
[im 30/30]
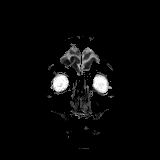

[Series 11: T2 · axial · 4.0mm · 0.36mm/px · 1 of 27 slices shown]
[im 1/27]
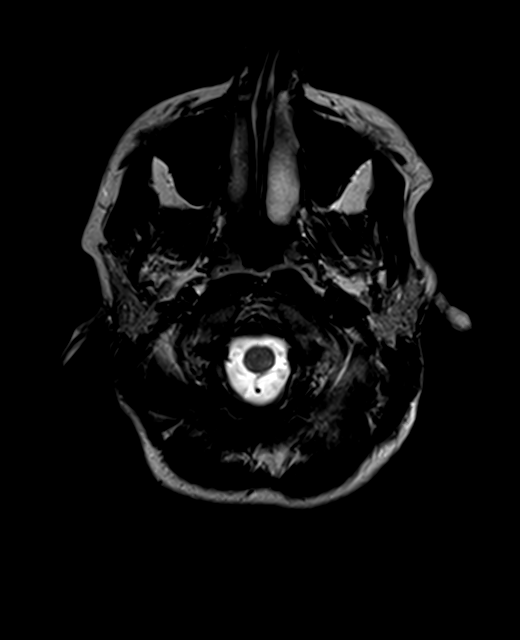

[Series 12: FLAIR · axial · 3.0mm · 0.72mm/px · 1 of 26 slices shown]
[im 1/26]
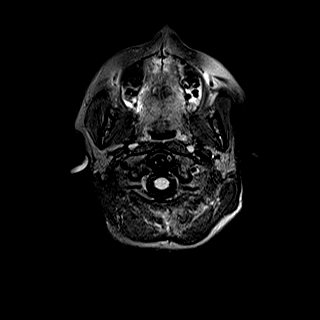

[Series 13: swi_images · axial · 1.5mm · 0.90mm/px · z∈[-14,+126]mm · 5 of 96 slices shown]
[im 1/96]
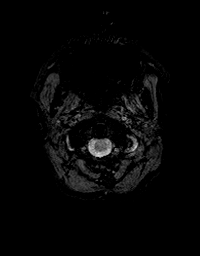
[im 24/96]
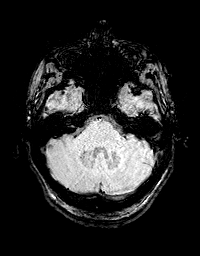
[im 48/96]
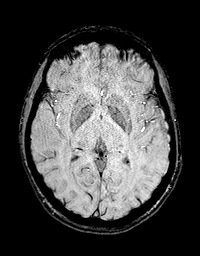
[im 72/96]
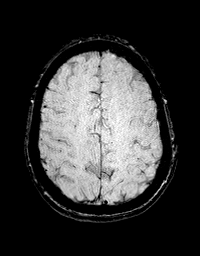
[im 96/96]
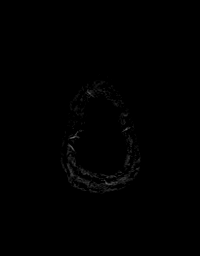

[Series 15: T1 · axial · 1.0mm · 0.94mm/px · z∈[-32,+126]mm · 8 of 160 slices shown (2 of 3)]
[im 1/160]
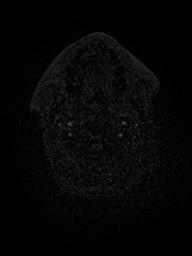
[im 23/160]
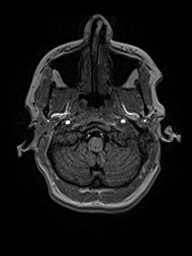
[im 46/160]
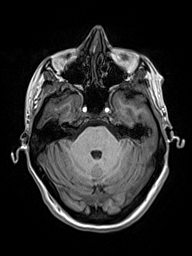
[im 69/160]
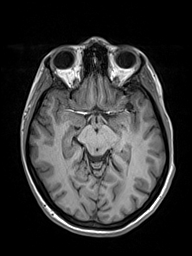
[im 91/160]
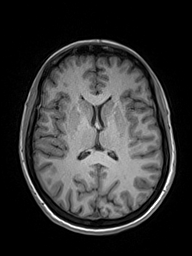
[im 114/160]
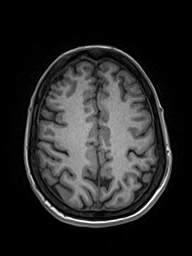
[im 137/160]
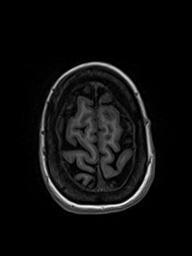
[im 160/160]
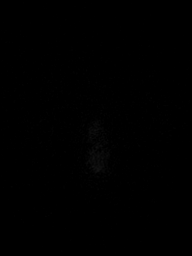

[Series 16: T2 post-contrast · coronal · 4.0mm · 0.36mm/px · 2 of 32 slices shown]
[im 1/32]
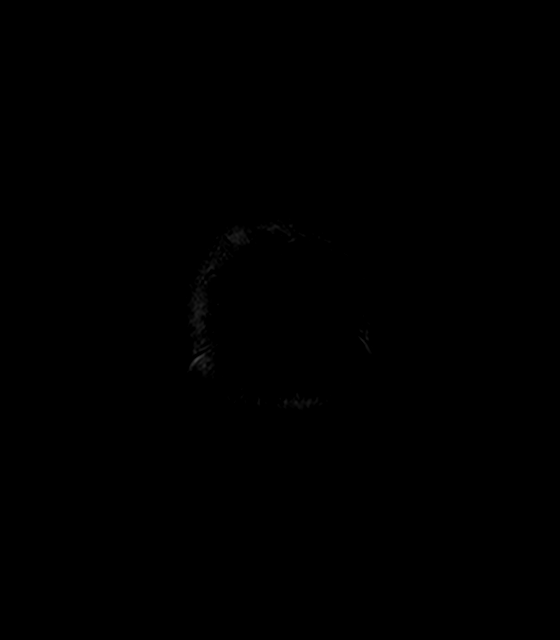
[im 32/32]
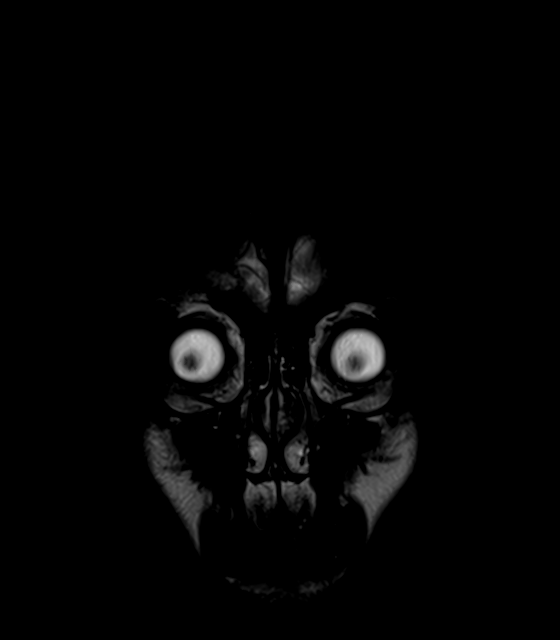

[Series 17: T1 · axial · 1.0mm · 0.94mm/px · z∈[-32,+126]mm · 8 of 160 slices shown (3 of 3)]
[im 1/160]
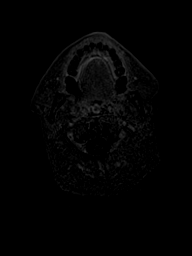
[im 23/160]
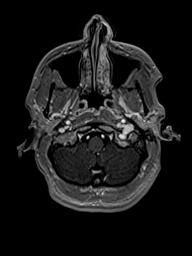
[im 46/160]
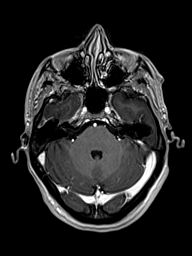
[im 69/160]
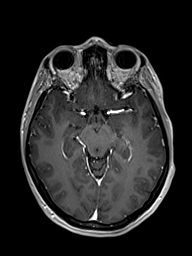
[im 91/160]
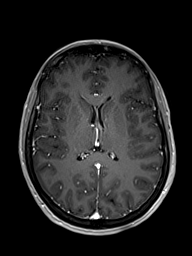
[im 114/160]
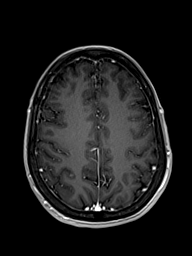
[im 137/160]
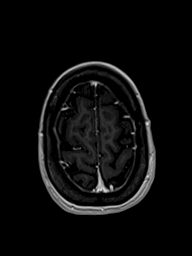
[im 160/160]
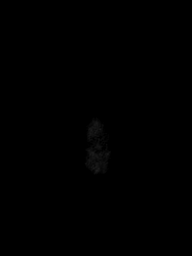

[Series 18: T1 post-contrast · coronal · 4.0mm · 0.72mm/px · 2 of 32 slices shown]
[im 1/32]
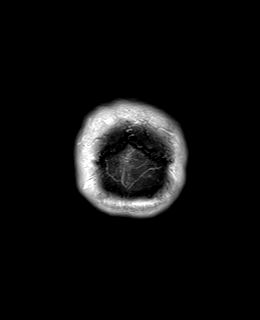
[im 32/32]
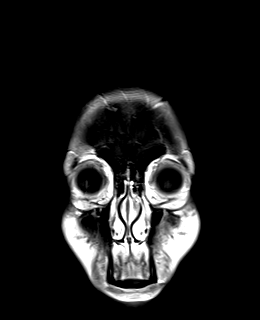

[48 of 48 positions shown; findings below may reference images not displayed]

FINDINGS: Brain: The brain has normal appearance without evidence of
malformation, old or acute small or large vessel infarction, mass
lesion, hemorrhage, hydrocephalus or extra-axial collection. After
contrast administration, no abnormal enhancement occurs. I disagree
with the previous report and do not think that there are any
developmental venous anomalies. Even if present, these would have
been insignificant.

Vascular: Major vessels at the base of the brain show flow.

Skull and upper cervical spine: Negative

Sinuses/Orbits: Clear/normal

Other: None
IMPRESSION: Normal MRI of the brain. No abnormality seen to explain the clinical
presentation.

## 2021-03-15 MED ORDER — GADOBENATE DIMEGLUMINE 529 MG/ML IV SOLN
10.0000 mL | Freq: Once | INTRAVENOUS | Status: AC | PRN
  Administered 2021-03-15: 10 mL via INTRAVENOUS

## 2021-03-17 DIAGNOSIS — G243 Spasmodic torticollis: Secondary | ICD-10-CM | POA: Diagnosis not present

## 2021-03-17 DIAGNOSIS — L659 Nonscarring hair loss, unspecified: Secondary | ICD-10-CM | POA: Diagnosis not present

## 2021-03-17 DIAGNOSIS — Z1329 Encounter for screening for other suspected endocrine disorder: Secondary | ICD-10-CM | POA: Diagnosis not present

## 2021-03-17 DIAGNOSIS — R5383 Other fatigue: Secondary | ICD-10-CM | POA: Diagnosis not present

## 2021-03-22 DIAGNOSIS — M5442 Lumbago with sciatica, left side: Secondary | ICD-10-CM | POA: Diagnosis not present

## 2021-03-22 DIAGNOSIS — M5136 Other intervertebral disc degeneration, lumbar region: Secondary | ICD-10-CM | POA: Diagnosis not present

## 2021-03-22 DIAGNOSIS — M546 Pain in thoracic spine: Secondary | ICD-10-CM | POA: Diagnosis not present

## 2021-03-22 DIAGNOSIS — M5413 Radiculopathy, cervicothoracic region: Secondary | ICD-10-CM | POA: Diagnosis not present

## 2021-03-29 DIAGNOSIS — M5136 Other intervertebral disc degeneration, lumbar region: Secondary | ICD-10-CM | POA: Diagnosis not present

## 2021-03-29 DIAGNOSIS — M5442 Lumbago with sciatica, left side: Secondary | ICD-10-CM | POA: Diagnosis not present

## 2021-03-29 DIAGNOSIS — M546 Pain in thoracic spine: Secondary | ICD-10-CM | POA: Diagnosis not present

## 2021-03-29 DIAGNOSIS — M5413 Radiculopathy, cervicothoracic region: Secondary | ICD-10-CM | POA: Diagnosis not present

## 2021-04-03 DIAGNOSIS — M5413 Radiculopathy, cervicothoracic region: Secondary | ICD-10-CM | POA: Diagnosis not present

## 2021-04-03 DIAGNOSIS — M5442 Lumbago with sciatica, left side: Secondary | ICD-10-CM | POA: Diagnosis not present

## 2021-04-03 DIAGNOSIS — M546 Pain in thoracic spine: Secondary | ICD-10-CM | POA: Diagnosis not present

## 2021-04-03 DIAGNOSIS — M5136 Other intervertebral disc degeneration, lumbar region: Secondary | ICD-10-CM | POA: Diagnosis not present

## 2021-04-05 ENCOUNTER — Other Ambulatory Visit: Payer: Self-pay

## 2021-04-05 ENCOUNTER — Encounter: Payer: Self-pay | Admitting: Physical Medicine & Rehabilitation

## 2021-04-05 ENCOUNTER — Encounter
Payer: BC Managed Care – PPO | Attending: Physical Medicine & Rehabilitation | Admitting: Physical Medicine & Rehabilitation

## 2021-04-05 VITALS — BP 105/72 | HR 87 | Temp 98.7°F | Ht 62.0 in | Wt 102.8 lb

## 2021-04-05 DIAGNOSIS — G243 Spasmodic torticollis: Secondary | ICD-10-CM | POA: Insufficient documentation

## 2021-04-05 NOTE — Progress Notes (Signed)
Dysport Injection for spasticity using needle EMG guidance Indication:  Cervical dystonia   Dilution: 500 Units/30ml        Total Units Injected:  800 Indication: Severe spasticity which interferes with ADL,mobility and/or  hygiene and is unresponsive to medication management and other conservative care Informed consent was obtained after describing risks and benefits of the procedure with the patient. This includes bleeding, bruising, infection, excessive weakness, or medication side effects. A REMS form is on file and signed.  Left  Needle: 38mm injectable monopolar needle electrode  Number of units per muscle Trapezius 50 units 1 access points Levator scapulae 150 units 2  access points Sternocleidomastoid 550 units 4 access points Scalenes 50 units 2 acces points   All injections were done after obtaining appropriate EMG activity and after negative drawback for blood. The patient tolerated the procedure well. Post procedure instructions were given.   Had 1 month of very nice results with the last set of injections.  Most of her EMG activity was in the sternocleidomastoid as well as levator scapula today.  We checked the right side as well and there was minimal activity there.  We focused the majority of her Dysport injection into these active muscles.

## 2021-04-05 NOTE — Patient Instructions (Signed)
PLEASE FEEL FREE TO CALL OUR OFFICE WITH ANY PROBLEMS OR QUESTIONS ([phone])      

## 2021-04-12 DIAGNOSIS — M5442 Lumbago with sciatica, left side: Secondary | ICD-10-CM | POA: Diagnosis not present

## 2021-04-12 DIAGNOSIS — M5136 Other intervertebral disc degeneration, lumbar region: Secondary | ICD-10-CM | POA: Diagnosis not present

## 2021-04-12 DIAGNOSIS — M5413 Radiculopathy, cervicothoracic region: Secondary | ICD-10-CM | POA: Diagnosis not present

## 2021-04-12 DIAGNOSIS — M546 Pain in thoracic spine: Secondary | ICD-10-CM | POA: Diagnosis not present

## 2021-04-17 DIAGNOSIS — M546 Pain in thoracic spine: Secondary | ICD-10-CM | POA: Diagnosis not present

## 2021-04-17 DIAGNOSIS — M5442 Lumbago with sciatica, left side: Secondary | ICD-10-CM | POA: Diagnosis not present

## 2021-04-17 DIAGNOSIS — M5136 Other intervertebral disc degeneration, lumbar region: Secondary | ICD-10-CM | POA: Diagnosis not present

## 2021-04-17 DIAGNOSIS — M5413 Radiculopathy, cervicothoracic region: Secondary | ICD-10-CM | POA: Diagnosis not present

## 2021-04-24 DIAGNOSIS — M5442 Lumbago with sciatica, left side: Secondary | ICD-10-CM | POA: Diagnosis not present

## 2021-04-24 DIAGNOSIS — M5413 Radiculopathy, cervicothoracic region: Secondary | ICD-10-CM | POA: Diagnosis not present

## 2021-04-24 DIAGNOSIS — M5136 Other intervertebral disc degeneration, lumbar region: Secondary | ICD-10-CM | POA: Diagnosis not present

## 2021-04-24 DIAGNOSIS — M546 Pain in thoracic spine: Secondary | ICD-10-CM | POA: Diagnosis not present

## 2021-05-01 DIAGNOSIS — G243 Spasmodic torticollis: Secondary | ICD-10-CM | POA: Diagnosis not present

## 2021-05-03 DIAGNOSIS — M5442 Lumbago with sciatica, left side: Secondary | ICD-10-CM | POA: Diagnosis not present

## 2021-05-03 DIAGNOSIS — M546 Pain in thoracic spine: Secondary | ICD-10-CM | POA: Diagnosis not present

## 2021-05-03 DIAGNOSIS — M5413 Radiculopathy, cervicothoracic region: Secondary | ICD-10-CM | POA: Diagnosis not present

## 2021-05-03 DIAGNOSIS — M5136 Other intervertebral disc degeneration, lumbar region: Secondary | ICD-10-CM | POA: Diagnosis not present

## 2021-05-10 DIAGNOSIS — M5136 Other intervertebral disc degeneration, lumbar region: Secondary | ICD-10-CM | POA: Diagnosis not present

## 2021-05-10 DIAGNOSIS — M5413 Radiculopathy, cervicothoracic region: Secondary | ICD-10-CM | POA: Diagnosis not present

## 2021-05-10 DIAGNOSIS — M5442 Lumbago with sciatica, left side: Secondary | ICD-10-CM | POA: Diagnosis not present

## 2021-05-10 DIAGNOSIS — M546 Pain in thoracic spine: Secondary | ICD-10-CM | POA: Diagnosis not present

## 2021-05-24 DIAGNOSIS — M5442 Lumbago with sciatica, left side: Secondary | ICD-10-CM | POA: Diagnosis not present

## 2021-05-24 DIAGNOSIS — M5413 Radiculopathy, cervicothoracic region: Secondary | ICD-10-CM | POA: Diagnosis not present

## 2021-05-24 DIAGNOSIS — M5136 Other intervertebral disc degeneration, lumbar region: Secondary | ICD-10-CM | POA: Diagnosis not present

## 2021-05-24 DIAGNOSIS — M546 Pain in thoracic spine: Secondary | ICD-10-CM | POA: Diagnosis not present

## 2021-05-29 DIAGNOSIS — M5136 Other intervertebral disc degeneration, lumbar region: Secondary | ICD-10-CM | POA: Diagnosis not present

## 2021-05-29 DIAGNOSIS — M546 Pain in thoracic spine: Secondary | ICD-10-CM | POA: Diagnosis not present

## 2021-05-29 DIAGNOSIS — M5442 Lumbago with sciatica, left side: Secondary | ICD-10-CM | POA: Diagnosis not present

## 2021-05-29 DIAGNOSIS — M5413 Radiculopathy, cervicothoracic region: Secondary | ICD-10-CM | POA: Diagnosis not present

## 2021-06-05 DIAGNOSIS — M5442 Lumbago with sciatica, left side: Secondary | ICD-10-CM | POA: Diagnosis not present

## 2021-06-05 DIAGNOSIS — M546 Pain in thoracic spine: Secondary | ICD-10-CM | POA: Diagnosis not present

## 2021-06-05 DIAGNOSIS — M5413 Radiculopathy, cervicothoracic region: Secondary | ICD-10-CM | POA: Diagnosis not present

## 2021-06-19 DIAGNOSIS — G243 Spasmodic torticollis: Secondary | ICD-10-CM | POA: Diagnosis not present

## 2021-06-21 DIAGNOSIS — M5413 Radiculopathy, cervicothoracic region: Secondary | ICD-10-CM | POA: Diagnosis not present

## 2021-06-21 DIAGNOSIS — M5442 Lumbago with sciatica, left side: Secondary | ICD-10-CM | POA: Diagnosis not present

## 2021-06-21 DIAGNOSIS — M546 Pain in thoracic spine: Secondary | ICD-10-CM | POA: Diagnosis not present

## 2021-06-26 DIAGNOSIS — M5413 Radiculopathy, cervicothoracic region: Secondary | ICD-10-CM | POA: Diagnosis not present

## 2021-06-26 DIAGNOSIS — M546 Pain in thoracic spine: Secondary | ICD-10-CM | POA: Diagnosis not present

## 2021-06-26 DIAGNOSIS — M5442 Lumbago with sciatica, left side: Secondary | ICD-10-CM | POA: Diagnosis not present

## 2021-07-03 DIAGNOSIS — M5442 Lumbago with sciatica, left side: Secondary | ICD-10-CM | POA: Diagnosis not present

## 2021-07-03 DIAGNOSIS — M5413 Radiculopathy, cervicothoracic region: Secondary | ICD-10-CM | POA: Diagnosis not present

## 2021-07-03 DIAGNOSIS — M546 Pain in thoracic spine: Secondary | ICD-10-CM | POA: Diagnosis not present

## 2021-07-05 ENCOUNTER — Encounter: Payer: Self-pay | Admitting: Physical Medicine & Rehabilitation

## 2021-07-05 ENCOUNTER — Other Ambulatory Visit: Payer: Self-pay

## 2021-07-05 ENCOUNTER — Encounter
Payer: BC Managed Care – PPO | Attending: Physical Medicine & Rehabilitation | Admitting: Physical Medicine & Rehabilitation

## 2021-07-05 VITALS — BP 116/75 | HR 90 | Temp 97.9°F | Ht 62.0 in | Wt 105.4 lb

## 2021-07-05 DIAGNOSIS — G243 Spasmodic torticollis: Secondary | ICD-10-CM | POA: Insufficient documentation

## 2021-07-05 NOTE — Patient Instructions (Signed)
PLEASE FEEL FREE TO CALL OUR OFFICE WITH ANY PROBLEMS OR QUESTIONS ([phone])      

## 2021-07-05 NOTE — Progress Notes (Signed)
Dysport Injection for spasticity using needle EMG guidance Indication:  Cervical dystonia     Dilution: 500 Units/12ml        Total Units Injected:  800 Indication: Severe spasticity which interferes with ADL,mobility and/or  hygiene and is unresponsive to medication management and other conservative care Informed consent was obtained after describing risks and benefits of the procedure with the patient. This includes bleeding, bruising, infection, excessive weakness, or medication side effects. A REMS form is on file and signed.   Left  Needle: 36mm injectable monopolar needle electrode   Number of units per muscle Trapezius 150 units 2 access points Levator scapulae 25 units  1  access points Sternocleidomastoid 600 units 4 access points Scalenes 25 units 2 access points     All injections were done after obtaining appropriate EMG activity and after negative drawback for blood. The patient tolerated the procedure well. Post procedure instructions were given.    Had 2 months of good results with the last set of injections.  Most of her EMG activity was in the sternocleidomastoid and upper trap today.  Consider further increase in dose based on length and effectiveness of response to today's injections

## 2021-07-11 DIAGNOSIS — M546 Pain in thoracic spine: Secondary | ICD-10-CM | POA: Diagnosis not present

## 2021-07-11 DIAGNOSIS — M542 Cervicalgia: Secondary | ICD-10-CM | POA: Diagnosis not present

## 2021-07-25 DIAGNOSIS — M546 Pain in thoracic spine: Secondary | ICD-10-CM | POA: Diagnosis not present

## 2021-07-25 DIAGNOSIS — M542 Cervicalgia: Secondary | ICD-10-CM | POA: Diagnosis not present

## 2021-08-01 DIAGNOSIS — G243 Spasmodic torticollis: Secondary | ICD-10-CM | POA: Diagnosis not present

## 2021-08-14 DIAGNOSIS — M546 Pain in thoracic spine: Secondary | ICD-10-CM | POA: Diagnosis not present

## 2021-08-14 DIAGNOSIS — M542 Cervicalgia: Secondary | ICD-10-CM | POA: Diagnosis not present

## 2021-08-24 DIAGNOSIS — M546 Pain in thoracic spine: Secondary | ICD-10-CM | POA: Diagnosis not present

## 2021-08-24 DIAGNOSIS — M542 Cervicalgia: Secondary | ICD-10-CM | POA: Diagnosis not present

## 2021-08-28 DIAGNOSIS — M542 Cervicalgia: Secondary | ICD-10-CM | POA: Diagnosis not present

## 2021-08-28 DIAGNOSIS — M546 Pain in thoracic spine: Secondary | ICD-10-CM | POA: Diagnosis not present

## 2021-09-06 DIAGNOSIS — M546 Pain in thoracic spine: Secondary | ICD-10-CM | POA: Diagnosis not present

## 2021-09-06 DIAGNOSIS — M542 Cervicalgia: Secondary | ICD-10-CM | POA: Diagnosis not present

## 2021-09-18 DIAGNOSIS — M542 Cervicalgia: Secondary | ICD-10-CM | POA: Diagnosis not present

## 2021-09-18 DIAGNOSIS — M546 Pain in thoracic spine: Secondary | ICD-10-CM | POA: Diagnosis not present

## 2021-09-25 DIAGNOSIS — M542 Cervicalgia: Secondary | ICD-10-CM | POA: Diagnosis not present

## 2021-09-25 DIAGNOSIS — G243 Spasmodic torticollis: Secondary | ICD-10-CM | POA: Diagnosis not present

## 2021-09-25 DIAGNOSIS — M546 Pain in thoracic spine: Secondary | ICD-10-CM | POA: Diagnosis not present

## 2021-10-03 DIAGNOSIS — M542 Cervicalgia: Secondary | ICD-10-CM | POA: Diagnosis not present

## 2021-10-03 DIAGNOSIS — M546 Pain in thoracic spine: Secondary | ICD-10-CM | POA: Diagnosis not present

## 2021-10-04 ENCOUNTER — Encounter: Payer: Self-pay | Admitting: Physical Medicine & Rehabilitation

## 2021-10-04 ENCOUNTER — Encounter
Payer: BC Managed Care – PPO | Attending: Physical Medicine & Rehabilitation | Admitting: Physical Medicine & Rehabilitation

## 2021-10-04 ENCOUNTER — Other Ambulatory Visit: Payer: Self-pay

## 2021-10-04 VITALS — BP 116/77 | HR 90 | Temp 98.5°F | Ht 62.0 in | Wt 103.0 lb

## 2021-10-04 DIAGNOSIS — G243 Spasmodic torticollis: Secondary | ICD-10-CM | POA: Insufficient documentation

## 2021-10-04 NOTE — Patient Instructions (Signed)
PLEASE FEEL FREE TO CALL OUR OFFICE WITH ANY PROBLEMS OR QUESTIONS ([phone])      

## 2021-10-04 NOTE — Progress Notes (Signed)
Dysport Injection for spasticity using needle EMG guidance ?Indication:  Cervical dystonia ?G24.3 ?  ?Dilution: 500 Units/72ml        Total Units Injected:  1000 ?Indication: Severe spasticity which interferes with ADL,mobility and/or  hygiene and is unresponsive to medication management and other conservative care ?Informed consent was obtained after describing risks and benefits of the procedure with the patient. This includes bleeding, bruising, infection, excessive weakness, or medication side effects. A REMS form is on file and signed. ?  ?Left  ?Needle: 18mm injectable monopolar needle electrode ?  ?Number of units per muscle ?Trapezius 175 units 2 access points ?Levator scapulae 25 units  1  access points ?Sternocleidomastoid 750 units 4 access points ?Scalenes 25 units 2 access points ?  ?  ?All injections were done after obtaining appropriate EMG activity and after negative drawback for blood. The patient tolerated the procedure well. Post procedure instructions were given.  Discussed things to look for after injections including neck weakness and difficulty swallowing. ?  ?Had 2+ months of good results with the last set of injections.  Most of her EMG activity remains in the sternocleidomastoid and upper trap.    ? ?

## 2021-10-18 DIAGNOSIS — M542 Cervicalgia: Secondary | ICD-10-CM | POA: Diagnosis not present

## 2021-10-18 DIAGNOSIS — M546 Pain in thoracic spine: Secondary | ICD-10-CM | POA: Diagnosis not present

## 2021-11-03 DIAGNOSIS — G243 Spasmodic torticollis: Secondary | ICD-10-CM | POA: Diagnosis not present

## 2021-11-03 DIAGNOSIS — Z681 Body mass index (BMI) 19 or less, adult: Secondary | ICD-10-CM | POA: Diagnosis not present

## 2021-11-15 DIAGNOSIS — M546 Pain in thoracic spine: Secondary | ICD-10-CM | POA: Diagnosis not present

## 2021-11-15 DIAGNOSIS — M542 Cervicalgia: Secondary | ICD-10-CM | POA: Diagnosis not present

## 2021-11-22 DIAGNOSIS — M542 Cervicalgia: Secondary | ICD-10-CM | POA: Diagnosis not present

## 2021-11-22 DIAGNOSIS — M546 Pain in thoracic spine: Secondary | ICD-10-CM | POA: Diagnosis not present

## 2021-11-30 DIAGNOSIS — M542 Cervicalgia: Secondary | ICD-10-CM | POA: Diagnosis not present

## 2021-11-30 DIAGNOSIS — M546 Pain in thoracic spine: Secondary | ICD-10-CM | POA: Diagnosis not present

## 2021-12-06 DIAGNOSIS — M546 Pain in thoracic spine: Secondary | ICD-10-CM | POA: Diagnosis not present

## 2021-12-06 DIAGNOSIS — M542 Cervicalgia: Secondary | ICD-10-CM | POA: Diagnosis not present

## 2021-12-13 DIAGNOSIS — M542 Cervicalgia: Secondary | ICD-10-CM | POA: Diagnosis not present

## 2021-12-13 DIAGNOSIS — M546 Pain in thoracic spine: Secondary | ICD-10-CM | POA: Diagnosis not present

## 2021-12-20 DIAGNOSIS — M542 Cervicalgia: Secondary | ICD-10-CM | POA: Diagnosis not present

## 2021-12-20 DIAGNOSIS — M546 Pain in thoracic spine: Secondary | ICD-10-CM | POA: Diagnosis not present

## 2022-01-02 DIAGNOSIS — S61511A Laceration without foreign body of right wrist, initial encounter: Secondary | ICD-10-CM | POA: Diagnosis not present

## 2022-01-17 ENCOUNTER — Encounter: Payer: Self-pay | Admitting: Physical Medicine & Rehabilitation

## 2022-01-17 ENCOUNTER — Encounter
Payer: BC Managed Care – PPO | Attending: Physical Medicine & Rehabilitation | Admitting: Physical Medicine & Rehabilitation

## 2022-01-17 VITALS — BP 110/69 | HR 80 | Ht 62.0 in | Wt 105.6 lb

## 2022-01-17 DIAGNOSIS — G243 Spasmodic torticollis: Secondary | ICD-10-CM | POA: Insufficient documentation

## 2022-01-17 NOTE — Progress Notes (Signed)
Dysport Injection for spasticity using needle EMG guidance Indication:  Cervical dystonia     Dilution: 500 Units/86ml        Total Units Injected: 1000 Indication: Severe spasticity which interferes with ADL,mobility and/or  hygiene and is unresponsive to medication management and other conservative care Informed consent was obtained after describing risks and benefits of the procedure with the patient. This includes bleeding, bruising, infection, excessive weakness, or medication side effects. A REMS form is on file and signed.   Side: left  Needle: 50mm injectable monopolar needle electrode   Number of units per muscle Trapezius 200 units 4 access points (superior aspect predominantly) Levator scapulae 100 units  1  access points Sternocleidomastoid 675 units 4 access points Scalenes 25 units 1 access points     All injections were done after obtaining appropriate EMG activity and after negative drawback for blood. The patient tolerated the procedure well. Post procedure instructions were given.

## 2022-01-17 NOTE — Patient Instructions (Signed)
PLEASE FEEL FREE TO CALL OUR OFFICE WITH ANY PROBLEMS OR QUESTIONS ([phone])      

## 2022-02-12 DIAGNOSIS — G243 Spasmodic torticollis: Secondary | ICD-10-CM | POA: Diagnosis not present

## 2022-02-28 DIAGNOSIS — M546 Pain in thoracic spine: Secondary | ICD-10-CM | POA: Diagnosis not present

## 2022-02-28 DIAGNOSIS — M542 Cervicalgia: Secondary | ICD-10-CM | POA: Diagnosis not present

## 2022-03-12 DIAGNOSIS — M542 Cervicalgia: Secondary | ICD-10-CM | POA: Diagnosis not present

## 2022-03-12 DIAGNOSIS — M546 Pain in thoracic spine: Secondary | ICD-10-CM | POA: Diagnosis not present

## 2022-03-27 DIAGNOSIS — S8992XA Unspecified injury of left lower leg, initial encounter: Secondary | ICD-10-CM | POA: Diagnosis not present

## 2022-03-27 DIAGNOSIS — W19XXXA Unspecified fall, initial encounter: Secondary | ICD-10-CM | POA: Diagnosis not present

## 2022-04-18 ENCOUNTER — Encounter: Payer: BC Managed Care – PPO | Admitting: Physical Medicine & Rehabilitation

## 2022-06-26 DIAGNOSIS — G243 Spasmodic torticollis: Secondary | ICD-10-CM | POA: Diagnosis not present

## 2022-07-11 ENCOUNTER — Encounter
Payer: BC Managed Care – PPO | Attending: Physical Medicine & Rehabilitation | Admitting: Physical Medicine & Rehabilitation

## 2022-07-11 ENCOUNTER — Encounter: Payer: Self-pay | Admitting: Physical Medicine & Rehabilitation

## 2022-07-11 VITALS — BP 98/64 | HR 81 | Temp 98.2°F | Ht 62.0 in | Wt 108.0 lb

## 2022-07-11 DIAGNOSIS — G243 Spasmodic torticollis: Secondary | ICD-10-CM | POA: Diagnosis not present

## 2022-07-11 MED ORDER — ABOBOTULINUMTOXINA 500 UNITS IM SOLR
1000.0000 [IU] | Freq: Once | INTRAMUSCULAR | Status: AC
  Administered 2022-07-11: 1000 [IU] via INTRAMUSCULAR

## 2022-07-11 NOTE — Progress Notes (Signed)
Dysport Injection for spasticity using needle EMG guidance Indication:  Cervical dystonia G24.3     Dilution: 500 Units/48ml        Total Units Injected: 1000 Indication: Severe spasticity which interferes with ADL,mobility and/or  hygiene and is unresponsive to medication management and other conservative care Informed consent was obtained after describing risks and benefits of the procedure with the patient. This includes bleeding, bruising, infection, excessive weakness, or medication side effects. A REMS form is on file and signed.   Side: left Needle: 20mm injectable monopolar needle electrode   Number of units per muscle Trapezius 225 units 4 access points (superior aspect predominantly as most active) Levator scapulae 75 units  1  access points Sternocleidomastoid 675 units 4 access points Scalenes 25 units 1 access points     All injections were done after obtaining appropriate EMG activity and after negative drawback for blood. The patient tolerated the procedure well. Post procedure instructions were given.

## 2022-07-11 NOTE — Patient Instructions (Signed)
ALWAYS FEEL FREE TO CALL OUR OFFICE WITH ANY PROBLEMS OR QUESTIONS ([phone])  **PLEASE NOTE** ALL MEDICATION REFILL REQUESTS (INCLUDING CONTROLLED SUBSTANCES) NEED TO BE MADE AT LEAST 7 DAYS PRIOR TO REFILL BEING DUE. ANY REFILL REQUESTS INSIDE THAT TIME FRAME MAY RESULT IN DELAYS IN RECEIVING YOUR PRESCRIPTION.                    

## 2022-11-07 ENCOUNTER — Encounter: Payer: Self-pay | Admitting: Physical Medicine & Rehabilitation

## 2022-11-07 ENCOUNTER — Encounter
Payer: BC Managed Care – PPO | Attending: Physical Medicine & Rehabilitation | Admitting: Physical Medicine & Rehabilitation

## 2022-11-07 DIAGNOSIS — G243 Spasmodic torticollis: Secondary | ICD-10-CM | POA: Insufficient documentation

## 2022-11-07 MED ORDER — ABOBOTULINUMTOXINA 500 UNITS IM SOLR
1000.0000 [IU] | Freq: Once | INTRAMUSCULAR | Status: AC
  Administered 2022-11-07: 1000 [IU] via INTRAMUSCULAR

## 2022-11-07 MED ORDER — SODIUM CHLORIDE (PF) 0.9 % IJ SOLN
2.0000 mL | Freq: Once | INTRAMUSCULAR | Status: AC
  Administered 2022-11-07: 2 mL via INTRAVENOUS

## 2022-11-07 NOTE — Progress Notes (Signed)
Dysport Injection for spasticity using needle EMG guidance Indication:  Cervical dystonia G24.3     Shelby Hahn had almost 4 months of relief with last set of injections. Just began to have spasms again 2 weeks ago.    Dilution: 500 Units/19ml        Total Units Injected: 1000 Indication: Severe spasticity which interferes with ADL,mobility and/or  hygiene and is unresponsive to medication management and other conservative care Informed consent was obtained after describing risks and benefits of the procedure with the patient. This includes bleeding, bruising, infection, excessive weakness, or medication side effects. A REMS form is on file and signed.   Side: LEFT  Needle: 50 mm injectable monopolar needle electrode   Number of units per muscle Trapezius 250 units 4 access points (superior aspect predominantly as most active) Levator scapulae 75 units  1  access points Sternocleidomastoid 650 units 4 access points Scalenes 25 units 1 access points     All injections were done after obtaining appropriate EMG activity and after negative drawback for blood. The patient tolerated the procedure well. Post procedure instructions were given.

## 2022-11-07 NOTE — Patient Instructions (Signed)
ALWAYS FEEL FREE TO CALL OUR OFFICE WITH ANY PROBLEMS OR QUESTIONS ([phone])  **PLEASE NOTE** ALL MEDICATION REFILL REQUESTS (INCLUDING CONTROLLED SUBSTANCES) NEED TO BE MADE AT LEAST 7 DAYS PRIOR TO REFILL BEING DUE. ANY REFILL REQUESTS INSIDE THAT TIME FRAME MAY RESULT IN DELAYS IN RECEIVING YOUR PRESCRIPTION.                    

## 2022-11-22 DIAGNOSIS — G243 Spasmodic torticollis: Secondary | ICD-10-CM | POA: Diagnosis not present

## 2023-03-06 ENCOUNTER — Encounter: Payer: Self-pay | Admitting: Physical Medicine & Rehabilitation

## 2023-03-06 ENCOUNTER — Encounter
Payer: BC Managed Care – PPO | Attending: Physical Medicine & Rehabilitation | Admitting: Physical Medicine & Rehabilitation

## 2023-03-06 VITALS — BP 117/71 | HR 84 | Ht 62.0 in | Wt 110.0 lb

## 2023-03-06 DIAGNOSIS — G243 Spasmodic torticollis: Secondary | ICD-10-CM | POA: Insufficient documentation

## 2023-03-06 MED ORDER — ABOBOTULINUMTOXINA 500 UNITS IM SOLR
1000.0000 [IU] | Freq: Once | INTRAMUSCULAR | Status: AC
  Administered 2023-03-06: 1000 [IU] via INTRAMUSCULAR

## 2023-03-06 NOTE — Progress Notes (Signed)
Dysport Injection for spasticity using needle EMG guidance Indication:  Cervical dystonia G24.3    Aylee continues to have improved responses to dysport. Spasms just started returning this week    Dilution: 500 Units/40ml        Total Units Injected: 1000 Indication: Severe spasticity which interferes with ADL,mobility and/or  hygiene and is unresponsive to medication management and other conservative care Informed consent was obtained after describing risks and benefits of the procedure with the patient. This includes bleeding, bruising, infection, excessive weakness, or medication side effects. A REMS form is on file and signed.   Side: Left Needle: 50 mm injectable monopolar needle electrode   Number of units per muscle Trapezius 225 units 4 access points  Levator scapulae 100 units  1  access points Sternocleidomastoid 650 units 4 access points Scalenes 25 units 1 access points     All injections were done after obtaining appropriate EMG activity and after negative drawback for blood. The patient tolerated the procedure well. Post procedure instructions were given.   F/u with me in about 4 months.

## 2023-03-06 NOTE — Patient Instructions (Signed)
ALWAYS FEEL FREE TO CALL OUR OFFICE WITH ANY PROBLEMS OR QUESTIONS ([phone])  **PLEASE NOTE** ALL MEDICATION REFILL REQUESTS (INCLUDING CONTROLLED SUBSTANCES) NEED TO BE MADE AT LEAST 7 DAYS PRIOR TO REFILL BEING DUE. ANY REFILL REQUESTS INSIDE THAT TIME FRAME MAY RESULT IN DELAYS IN RECEIVING YOUR PRESCRIPTION.                    

## 2023-03-15 DIAGNOSIS — Z2914 Encounter for prophylactic rabies immune globin: Secondary | ICD-10-CM | POA: Diagnosis not present

## 2023-03-15 DIAGNOSIS — Z23 Encounter for immunization: Secondary | ICD-10-CM | POA: Diagnosis not present

## 2023-03-15 DIAGNOSIS — Z203 Contact with and (suspected) exposure to rabies: Secondary | ICD-10-CM | POA: Diagnosis not present

## 2023-03-18 DIAGNOSIS — Z23 Encounter for immunization: Secondary | ICD-10-CM | POA: Diagnosis not present

## 2023-03-18 DIAGNOSIS — Z203 Contact with and (suspected) exposure to rabies: Secondary | ICD-10-CM | POA: Diagnosis not present

## 2023-03-18 DIAGNOSIS — Z2914 Encounter for prophylactic rabies immune globin: Secondary | ICD-10-CM | POA: Diagnosis not present

## 2023-07-03 ENCOUNTER — Encounter: Payer: Self-pay | Admitting: Physical Medicine & Rehabilitation

## 2023-07-03 ENCOUNTER — Encounter
Payer: BC Managed Care – PPO | Attending: Physical Medicine & Rehabilitation | Admitting: Physical Medicine & Rehabilitation

## 2023-07-03 VITALS — BP 138/71 | HR 123 | Ht 62.0 in | Wt 107.0 lb

## 2023-07-03 DIAGNOSIS — G243 Spasmodic torticollis: Secondary | ICD-10-CM | POA: Diagnosis not present

## 2023-07-03 MED ORDER — ABOBOTULINUMTOXINA 500 UNITS IM SOLR
1000.0000 [IU] | Freq: Once | INTRAMUSCULAR | Status: AC
  Administered 2023-07-03: 1000 [IU] via INTRAMUSCULAR

## 2023-07-03 NOTE — Progress Notes (Signed)
Dysport Injection for spasticity using needle EMG guidance Indication:  Cervical dystonia  G24.3   Dilution: 500 Units/41ml        Total Units Injected: 1000 Indication: Severe spasticity which interferes with ADL,mobility and/or  hygiene and is unresponsive to medication management and other conservative care Informed consent was obtained after describing risks and benefits of the procedure with the patient. This includes bleeding, bruising, infection, excessive weakness, or medication side effects. A REMS form is on file and signed.   Side: left  Needle: 50mm injectable monopolar needle electrode   Number of units per muscle Trapezius 250 units 4 access points Levator scapulae 50 units  2  access points Sternocleidomastoid 650 units 4 access points Scalenes 50 units 1 access points     All injections were done after obtaining appropriate EMG activity and after negative drawback for blood. The patient tolerated the procedure well. Post procedure instructions were given.    Shawntel continues to improve. EMG activity was improved today. Will stretch f/u to 5 mos.

## 2023-07-03 NOTE — Patient Instructions (Signed)
ALWAYS FEEL FREE TO CALL OUR OFFICE WITH ANY PROBLEMS OR QUESTIONS ([phone])  **PLEASE NOTE** ALL MEDICATION REFILL REQUESTS (INCLUDING CONTROLLED SUBSTANCES) NEED TO BE MADE AT LEAST 7 DAYS PRIOR TO REFILL BEING DUE. ANY REFILL REQUESTS INSIDE THAT TIME FRAME MAY RESULT IN DELAYS IN RECEIVING YOUR PRESCRIPTION.                    

## 2023-07-29 ENCOUNTER — Other Ambulatory Visit: Payer: Self-pay | Admitting: Student

## 2023-07-29 MED ORDER — AMOXICILLIN-POT CLAVULANATE 875-125 MG PO TABS: 1.0000 | ORAL_TABLET | Freq: Two times a day (BID) | ORAL | 0 refills | Status: AC

## 2023-07-30 ENCOUNTER — Other Ambulatory Visit: Payer: Self-pay | Admitting: Student

## 2023-07-30 MED ORDER — FLUTICASONE PROPIONATE 50 MCG/ACT NA SUSP: 1.0000 | Freq: Every day | NASAL | 1 refills | Status: DC

## 2023-08-01 DIAGNOSIS — G243 Spasmodic torticollis: Secondary | ICD-10-CM | POA: Diagnosis not present

## 2023-12-04 ENCOUNTER — Encounter
Payer: BC Managed Care – PPO | Attending: Physical Medicine & Rehabilitation | Admitting: Physical Medicine & Rehabilitation

## 2023-12-04 ENCOUNTER — Encounter: Payer: Self-pay | Admitting: Physical Medicine & Rehabilitation

## 2023-12-04 VITALS — BP 118/81 | HR 95 | Ht 62.0 in | Wt 110.0 lb

## 2023-12-04 DIAGNOSIS — G243 Spasmodic torticollis: Secondary | ICD-10-CM | POA: Insufficient documentation

## 2023-12-04 MED ORDER — ABOBOTULINUMTOXINA 500 UNITS IM SOLR
1000.0000 [IU] | Freq: Once | INTRAMUSCULAR | Status: AC
  Administered 2023-12-04: 1000 [IU] via INTRAMUSCULAR

## 2023-12-04 NOTE — Patient Instructions (Signed)
 ALWAYS FEEL FREE TO CALL OUR OFFICE WITH ANY PROBLEMS OR QUESTIONS [phone])  **PLEASE NOTE** ALL MEDICATION REFILL REQUESTS (INCLUDING CONTROLLED SUBSTANCES) NEED TO BE MADE AT LEAST 7 DAYS PRIOR TO REFILL BEING DUE. ANY REFILL REQUESTS INSIDE THAT TIME FRAME MAY RESULT IN DELAYS IN RECEIVING YOUR PRESCRIPTION.

## 2023-12-04 NOTE — Progress Notes (Signed)
 Dysport  Injection for spasticity using needle EMG guidance Indication:  Cervical dystonia G24.3   Dilution: 500 Units/2ml        Total Units Injected: 1000 Indication: Severe spasticity which interferes with ADL,mobility and/or  hygiene and is unresponsive to medication management and other conservative care Informed consent was obtained after describing risks and benefits of the procedure with the patient. This includes bleeding, bruising, infection, excessive weakness, or medication side effects. A REMS form is on file and signed.   Side: right  Needle: 50mm injectable monopolar needle electrode   Number of units per muscle Trapezius 400 units 4 access points Levator scapulae 100 units  2  access points Sternocleidomastoid 500 units 4 access points Scalenes 0 units 0 access points     All injections were done after obtaining appropriate EMG activity and after negative drawback for blood. The patient tolerated the procedure well. Post procedure instructions were given.    She is experiencing less and less spasticity. Will stretch her out to 6 mos for fu. She'll call me if she needs to come in sooner.

## 2024-05-15 DIAGNOSIS — G243 Spasmodic torticollis: Secondary | ICD-10-CM | POA: Diagnosis not present

## 2024-06-03 ENCOUNTER — Telehealth: Payer: Self-pay

## 2024-06-03 ENCOUNTER — Encounter: Payer: Self-pay | Admitting: Physical Medicine & Rehabilitation

## 2024-06-03 ENCOUNTER — Encounter: Attending: Physical Medicine & Rehabilitation | Admitting: Physical Medicine & Rehabilitation

## 2024-06-03 VITALS — BP 129/80 | HR 87 | Ht 62.0 in | Wt 109.0 lb

## 2024-06-03 DIAGNOSIS — G243 Spasmodic torticollis: Secondary | ICD-10-CM | POA: Diagnosis not present

## 2024-06-03 MED ORDER — SODIUM CHLORIDE (PF) 0.9 % IJ SOLN: 10.0000 mL | INTRAMUSCULAR | Status: AC | PRN

## 2024-06-03 MED ORDER — ABOBOTULINUMTOXINA 500 UNITS IM SOLR
1000.0000 [IU] | Freq: Once | INTRAMUSCULAR | Status: AC
  Administered 2024-06-03: 1000 [IU] via INTRAMUSCULAR

## 2024-06-03 NOTE — Patient Instructions (Signed)
 ALWAYS FEEL FREE TO CALL OUR OFFICE WITH ANY PROBLEMS OR QUESTIONS [phone])  **PLEASE NOTE** ALL MEDICATION REFILL REQUESTS (INCLUDING CONTROLLED SUBSTANCES) NEED TO BE MADE AT LEAST 7 DAYS PRIOR TO REFILL BEING DUE. ANY REFILL REQUESTS INSIDE THAT TIME FRAME MAY RESULT IN DELAYS IN RECEIVING YOUR PRESCRIPTION.

## 2024-06-03 NOTE — Progress Notes (Signed)
 Dysport  Injection for spasticity using needle EMG guidance Indication:  Cervical dystonia Last injection 12/04/23   Dilution: 500 Units/2ml        Total Units Injected: 1000 Indication: Severe spasticity which interferes with ADL,mobility and/or  hygiene and is unresponsive to medication management and other conservative care Informed consent was obtained after describing risks and benefits of the procedure with the patient. This includes bleeding, bruising, infection, excessive weakness, or medication side effects. A REMS form is on file and signed.   Side: right  Needle: 50mm injectable monopolar needle electrode   Number of units per muscle Trapezius 300 units 4 access points    Levator scapulae 100 units  2  access points    Sternocleidomastoid 500 units 4 access points    Scalenes 100 units 2 access points  0     All injections were done after obtaining appropriate EMG activity and after negative drawback for blood. The patient tolerated the procedure well. Post procedure instructions were given.

## 2024-06-15 NOTE — Telephone Encounter (Signed)
 No answer, going to voicemail

## 2025-01-06 ENCOUNTER — Encounter: Admitting: Physical Medicine & Rehabilitation
